# Patient Record
Sex: Male | Born: 1945 | Race: White | Hispanic: No | Marital: Married | Smoking: Former smoker
Health system: Southern US, Community
[De-identification: ages and names within clinical notes are randomized; demographics above are authoritative.]

## PROBLEM LIST (undated history)

## (undated) DIAGNOSIS — I712 Thoracic aortic aneurysm, without rupture, unspecified: Secondary | ICD-10-CM

## (undated) DIAGNOSIS — I2510 Atherosclerotic heart disease of native coronary artery without angina pectoris: Secondary | ICD-10-CM

## (undated) DIAGNOSIS — F329 Major depressive disorder, single episode, unspecified: Secondary | ICD-10-CM

## (undated) DIAGNOSIS — I10 Essential (primary) hypertension: Secondary | ICD-10-CM

## (undated) DIAGNOSIS — G8929 Other chronic pain: Secondary | ICD-10-CM

## (undated) DIAGNOSIS — I9789 Other postprocedural complications and disorders of the circulatory system, not elsewhere classified: Secondary | ICD-10-CM

## (undated) DIAGNOSIS — J45909 Unspecified asthma, uncomplicated: Secondary | ICD-10-CM

## (undated) DIAGNOSIS — I739 Peripheral vascular disease, unspecified: Secondary | ICD-10-CM

## (undated) DIAGNOSIS — Z951 Presence of aortocoronary bypass graft: Secondary | ICD-10-CM

## (undated) DIAGNOSIS — G629 Polyneuropathy, unspecified: Secondary | ICD-10-CM

## (undated) DIAGNOSIS — B3781 Candidal esophagitis: Secondary | ICD-10-CM

## (undated) DIAGNOSIS — F419 Anxiety disorder, unspecified: Secondary | ICD-10-CM

## (undated) DIAGNOSIS — Z789 Other specified health status: Secondary | ICD-10-CM

## (undated) DIAGNOSIS — I4891 Unspecified atrial fibrillation: Secondary | ICD-10-CM

## (undated) DIAGNOSIS — I359 Nonrheumatic aortic valve disorder, unspecified: Secondary | ICD-10-CM

## (undated) DIAGNOSIS — E785 Hyperlipidemia, unspecified: Secondary | ICD-10-CM

## (undated) DIAGNOSIS — G4733 Obstructive sleep apnea (adult) (pediatric): Secondary | ICD-10-CM

## (undated) DIAGNOSIS — G10 Huntington's disease: Secondary | ICD-10-CM

## (undated) DIAGNOSIS — M1990 Unspecified osteoarthritis, unspecified site: Secondary | ICD-10-CM

## (undated) DIAGNOSIS — K219 Gastro-esophageal reflux disease without esophagitis: Secondary | ICD-10-CM

## (undated) DIAGNOSIS — D649 Anemia, unspecified: Secondary | ICD-10-CM

## (undated) HISTORY — PX: FRACTURE SURGERY: SHX138

## (undated) HISTORY — PX: TONSILLECTOMY: SUR1361

## (undated) HISTORY — PX: ELECTROCONVULSIVE THERAPY: SHX1495

## (undated) HISTORY — PX: COLONOSCOPY: SHX174

---

## 2004-02-14 HISTORY — PX: LUMBAR LAMINECTOMY: SHX95

## 2006-02-21 DIAGNOSIS — I219 Acute myocardial infarction, unspecified: Secondary | ICD-10-CM

## 2006-02-21 HISTORY — PX: PERCUTANEOUS CORONARY STENT INTERVENTION (PCI-S): SHX6016

## 2006-02-21 HISTORY — DX: Acute myocardial infarction, unspecified: I21.9

## 2006-03-15 HISTORY — PX: CARDIAC CATHETERIZATION: SHX172

## 2006-04-03 DIAGNOSIS — I771 Stricture of artery: Secondary | ICD-10-CM

## 2006-04-03 HISTORY — PX: ANGIOPLASTY / STENTING ILIAC: SUR31

## 2006-04-03 HISTORY — DX: Stricture of artery: I77.1

## 2006-09-18 HISTORY — PX: PERCUTANEOUS CORONARY STENT INTERVENTION (PCI-S): SHX6016

## 2008-02-14 HISTORY — PX: TOTAL KNEE ARTHROPLASTY: SHX125

## 2009-11-13 HISTORY — PX: REVISION TOTAL KNEE ARTHROPLASTY: SHX767

## 2010-09-12 DIAGNOSIS — F32A Depression, unspecified: Secondary | ICD-10-CM | POA: Insufficient documentation

## 2011-02-14 DIAGNOSIS — E782 Mixed hyperlipidemia: Secondary | ICD-10-CM | POA: Insufficient documentation

## 2011-02-14 DIAGNOSIS — E349 Endocrine disorder, unspecified: Secondary | ICD-10-CM | POA: Insufficient documentation

## 2011-02-14 DIAGNOSIS — I10 Essential (primary) hypertension: Secondary | ICD-10-CM | POA: Insufficient documentation

## 2011-02-14 DIAGNOSIS — J45909 Unspecified asthma, uncomplicated: Secondary | ICD-10-CM | POA: Insufficient documentation

## 2011-03-17 ENCOUNTER — Emergency Department: Payer: Self-pay | Admitting: Emergency Medicine

## 2011-03-23 HISTORY — PX: CARDIAC CATHETERIZATION: SHX172

## 2011-03-29 HISTORY — PX: CORONARY ARTERY BYPASS GRAFT: SHX141

## 2011-04-27 ENCOUNTER — Encounter: Payer: Self-pay | Admitting: Internal Medicine

## 2011-05-15 ENCOUNTER — Encounter: Payer: Self-pay | Admitting: Internal Medicine

## 2011-06-14 ENCOUNTER — Encounter: Payer: Self-pay | Admitting: Internal Medicine

## 2011-08-08 DIAGNOSIS — G8929 Other chronic pain: Secondary | ICD-10-CM | POA: Insufficient documentation

## 2011-09-21 DIAGNOSIS — M961 Postlaminectomy syndrome, not elsewhere classified: Secondary | ICD-10-CM | POA: Insufficient documentation

## 2011-09-21 DIAGNOSIS — F1190 Opioid use, unspecified, uncomplicated: Secondary | ICD-10-CM | POA: Diagnosis present

## 2011-09-21 HISTORY — DX: Postlaminectomy syndrome, not elsewhere classified: M96.1

## 2012-07-15 DIAGNOSIS — Z79899 Other long term (current) drug therapy: Secondary | ICD-10-CM | POA: Insufficient documentation

## 2012-07-15 DIAGNOSIS — E291 Testicular hypofunction: Secondary | ICD-10-CM | POA: Insufficient documentation

## 2012-07-15 DIAGNOSIS — N138 Other obstructive and reflux uropathy: Secondary | ICD-10-CM | POA: Insufficient documentation

## 2012-08-13 DIAGNOSIS — I7101 Dissection of ascending aorta: Secondary | ICD-10-CM | POA: Insufficient documentation

## 2012-08-14 HISTORY — PX: REPAIR OF ACUTE ASCENDING THORACIC AORTIC DISSECTION: SHX6323

## 2012-08-19 DIAGNOSIS — I4891 Unspecified atrial fibrillation: Secondary | ICD-10-CM | POA: Insufficient documentation

## 2012-09-16 ENCOUNTER — Encounter: Payer: Self-pay | Admitting: Cardiothoracic Surgery

## 2012-10-14 ENCOUNTER — Encounter: Payer: Self-pay | Admitting: Cardiothoracic Surgery

## 2012-11-13 ENCOUNTER — Encounter: Payer: Self-pay | Admitting: Cardiothoracic Surgery

## 2012-12-14 ENCOUNTER — Encounter: Payer: Self-pay | Admitting: Cardiothoracic Surgery

## 2013-01-13 ENCOUNTER — Encounter: Payer: Self-pay | Admitting: Cardiothoracic Surgery

## 2013-02-13 ENCOUNTER — Encounter: Payer: Self-pay | Admitting: Cardiothoracic Surgery

## 2013-05-13 DIAGNOSIS — B3781 Candidal esophagitis: Secondary | ICD-10-CM | POA: Insufficient documentation

## 2014-04-01 DIAGNOSIS — F330 Major depressive disorder, recurrent, mild: Secondary | ICD-10-CM | POA: Insufficient documentation

## 2014-06-08 DIAGNOSIS — Z8679 Personal history of other diseases of the circulatory system: Secondary | ICD-10-CM

## 2014-06-08 DIAGNOSIS — Z955 Presence of coronary angioplasty implant and graft: Secondary | ICD-10-CM

## 2014-08-30 DIAGNOSIS — I359 Nonrheumatic aortic valve disorder, unspecified: Secondary | ICD-10-CM | POA: Insufficient documentation

## 2015-03-26 DIAGNOSIS — R339 Retention of urine, unspecified: Secondary | ICD-10-CM | POA: Insufficient documentation

## 2017-06-11 DIAGNOSIS — I712 Thoracic aortic aneurysm, without rupture, unspecified: Secondary | ICD-10-CM | POA: Insufficient documentation

## 2018-08-01 DIAGNOSIS — Z298 Encounter for other specified prophylactic measures: Secondary | ICD-10-CM | POA: Insufficient documentation

## 2018-09-12 ENCOUNTER — Other Ambulatory Visit (HOSPITAL_COMMUNITY): Payer: Self-pay | Admitting: Student

## 2018-09-12 ENCOUNTER — Other Ambulatory Visit: Payer: Self-pay | Admitting: Student

## 2018-09-12 DIAGNOSIS — M545 Low back pain, unspecified: Secondary | ICD-10-CM

## 2018-09-12 DIAGNOSIS — G8929 Other chronic pain: Secondary | ICD-10-CM

## 2018-09-23 ENCOUNTER — Ambulatory Visit
Admission: RE | Admit: 2018-09-23 | Discharge: 2018-09-23 | Disposition: A | Discharge status: Home / Self Care | Payer: Medicare Other | Source: Ambulatory Visit | Attending: Student | Admitting: Student

## 2018-09-23 DIAGNOSIS — G8929 Other chronic pain: Secondary | ICD-10-CM | POA: Diagnosis present

## 2018-09-23 DIAGNOSIS — M545 Low back pain: Secondary | ICD-10-CM | POA: Insufficient documentation

## 2019-02-18 HISTORY — PX: CATARACT EXTRACTION W/ INTRAOCULAR LENS IMPLANT: SHX1309

## 2019-03-04 HISTORY — PX: CATARACT EXTRACTION W/ INTRAOCULAR LENS IMPLANT: SHX1309

## 2019-05-06 HISTORY — PX: BLEPHAROPLASTY: SUR158

## 2020-04-09 DIAGNOSIS — G729 Myopathy, unspecified: Secondary | ICD-10-CM | POA: Insufficient documentation

## 2020-07-29 ENCOUNTER — Other Ambulatory Visit: Payer: Self-pay | Admitting: Orthopedic Surgery

## 2020-08-12 ENCOUNTER — Encounter: Payer: Self-pay | Admitting: Orthopedic Surgery

## 2020-08-12 ENCOUNTER — Encounter
Admission: RE | Admit: 2020-08-12 | Discharge: 2020-08-12 | Disposition: A | Discharge status: Home / Self Care | Payer: Medicare Other | Source: Ambulatory Visit | Attending: Orthopedic Surgery | Admitting: Orthopedic Surgery

## 2020-08-12 ENCOUNTER — Other Ambulatory Visit: Payer: Self-pay

## 2020-08-12 DIAGNOSIS — Z01818 Encounter for other preprocedural examination: Secondary | ICD-10-CM | POA: Insufficient documentation

## 2020-08-12 HISTORY — DX: Hyperlipidemia, unspecified: E78.5

## 2020-08-12 HISTORY — DX: Unspecified asthma, uncomplicated: J45.909

## 2020-08-12 HISTORY — DX: Anxiety disorder, unspecified: F41.9

## 2020-08-12 HISTORY — DX: Huntington's disease: G10

## 2020-08-12 HISTORY — DX: Polyneuropathy, unspecified: G62.9

## 2020-08-12 HISTORY — DX: Anemia, unspecified: D64.9

## 2020-08-12 HISTORY — DX: Atherosclerotic heart disease of native coronary artery without angina pectoris: I25.10

## 2020-08-12 HISTORY — DX: Peripheral vascular disease, unspecified: I73.9

## 2020-08-12 HISTORY — DX: Gastro-esophageal reflux disease without esophagitis: K21.9

## 2020-08-12 HISTORY — DX: Essential (primary) hypertension: I10

## 2020-08-12 HISTORY — DX: Unspecified osteoarthritis, unspecified site: M19.90

## 2020-08-12 LAB — COMPREHENSIVE METABOLIC PANEL
ALT: 21 U/L (ref 0–44)
AST: 23 U/L (ref 15–41)
Albumin: 3.9 g/dL (ref 3.5–5.0)
Alkaline Phosphatase: 61 U/L (ref 38–126)
Anion gap: 9 (ref 5–15)
BUN: 17 mg/dL (ref 8–23)
CO2: 27 mmol/L (ref 22–32)
Calcium: 9.5 mg/dL (ref 8.9–10.3)
Chloride: 103 mmol/L (ref 98–111)
Creatinine, Ser: 1.51 mg/dL — ABNORMAL HIGH (ref 0.61–1.24)
GFR, Estimated: 48 mL/min — ABNORMAL LOW (ref >60)
Glucose, Bld: 107 mg/dL — ABNORMAL HIGH (ref 70–99)
Potassium: 4.3 mmol/L (ref 3.5–5.1)
Sodium: 139 mmol/L (ref 135–145)
Total Bilirubin: 0.9 mg/dL (ref 0.3–1.2)
Total Protein: 7.2 g/dL (ref 6.5–8.1)

## 2020-08-12 LAB — CBC WITH DIFFERENTIAL/PLATELET
Abs Immature Granulocytes: 0.06 10*3/uL (ref 0.00–0.07)
Basophils Absolute: 0.1 10*3/uL (ref 0.0–0.1)
Basophils Relative: 1 %
Eosinophils Absolute: 0.2 10*3/uL (ref 0.0–0.5)
Eosinophils Relative: 1 %
HCT: 44.6 % (ref 39.0–52.0)
Hemoglobin: 15.8 g/dL (ref 13.0–17.0)
Immature Granulocytes: 0 %
Lymphocytes Relative: 26 %
Lymphs Abs: 3.7 10*3/uL (ref 0.7–4.0)
MCH: 32.8 pg (ref 26.0–34.0)
MCHC: 35.4 g/dL (ref 30.0–36.0)
MCV: 92.5 fL (ref 80.0–100.0)
Monocytes Absolute: 0.9 10*3/uL (ref 0.1–1.0)
Monocytes Relative: 6 %
Neutro Abs: 9.3 10*3/uL — ABNORMAL HIGH (ref 1.7–7.7)
Neutrophils Relative %: 66 %
Platelets: 395 10*3/uL (ref 150–400)
RBC: 4.82 MIL/uL (ref 4.22–5.81)
RDW: 12.5 % (ref 11.5–15.5)
WBC: 14.2 10*3/uL — ABNORMAL HIGH (ref 4.0–10.5)
nRBC: 0 % (ref 0.0–0.2)

## 2020-08-12 LAB — URINALYSIS, ROUTINE W REFLEX MICROSCOPIC
Bilirubin Urine: NEGATIVE
Glucose, UA: NEGATIVE mg/dL
Hgb urine dipstick: NEGATIVE
Ketones, ur: NEGATIVE mg/dL
Leukocytes,Ua: NEGATIVE
Nitrite: NEGATIVE
Protein, ur: NEGATIVE mg/dL
Specific Gravity, Urine: 1.014 (ref 1.005–1.030)
pH: 7 (ref 5.0–8.0)

## 2020-08-12 LAB — TYPE AND SCREEN
ABO/RH(D): A POS
Antibody Screen: NEGATIVE

## 2020-08-12 LAB — SURGICAL PCR SCREEN
MRSA, PCR: NEGATIVE
Staphylococcus aureus: POSITIVE — AB

## 2020-08-12 NOTE — Patient Instructions (Addendum)
Your procedure is scheduled on: 08/24/2020 Report to the Registration Desk on the 1st floor of the [facility]. To find out your arrival time, please call [phone] between 1PM - 3PM on: 08/23/2020  REMEMBER: Instructions that are not followed completely may result in serious medical risk, up to and including death; or upon the discretion of your surgeon and anesthesiologist your surgery may need to be rescheduled.  Do not eat food after midnight the night before surgery.  No gum chewing, lozengers or hard candies.  You may however, drink CLEAR liquids up to 2 hours before you are scheduled to arrive for your surgery. Do not drink anything within 2 hours of your scheduled arrival time.  Clear liquids include: - water  - apple juice without pulp - gatorade  - black coffee or tea (Do NOT add milk or creamers to the coffee or tea) Do NOT drink anything that is not on this list.  In addition, your doctor has ordered for you to drink the provided  Ensure Pre-Surgery Clear Carbohydrate Drink  Drinking this carbohydrate drink up to two hours before surgery helps to reduce insulin resistance and improve patient outcomes. Please complete drinking 2 hours prior to scheduled arrival time.  TAKE THESE MEDICATIONS THE MORNING OF SURGERY WITH A SIP OF WATER: - Pantoprazole (take one the night before and one on the morning of surgery - helps to prevent nausea after surgery.)  Use inhalers on the day of surgery and bring to the hospital.  One week prior to surgery: Stop Anti-inflammatories (NSAIDS) such as Advil, Aleve, Ibuprofen, Motrin, Naproxen, Naprosyn and Aspirin based products such as Excedrin, Goodys Powder, BC Powder. You may however, continue to take Tylenol if needed for pain up until the day of surgery.  Stop ANY OVER THE COUNTER vitamins and supplements until after surgery.   No Alcohol for 24 hours before or after surgery.  No Smoking including e-cigarettes for 24 hours prior  to surgery.  No chewable tobacco products for at least 6 hours prior to surgery.  No nicotine patches on the day of surgery.  Do not use any "recreational" drugs for at least a week prior to your surgery.  Please be advised that the combination of cocaine and anesthesia may have negative outcomes, up to and including death. If you test positive for cocaine, your surgery will be cancelled.  On the morning of surgery brush your teeth with toothpaste and water, you may rinse your mouth with mouthwash if you wish. Do not swallow any toothpaste or mouthwash.  Do not wear lotions, powders, or perfumes.   Do not shave body from the neck down 48 hours prior to surgery just in case you cut yourself which could leave a site for infection.  Also, freshly shaved skin may become irritated if using the CHG soap.  Contact lenses, hearing aids and dentures may not be worn into surgery.  Do not bring valuables to the hospital. [facility] is not responsible for any missing/lost belongings or valuables.   Use CHG Soap or wipes as directed on instruction sheet.  Notify your doctor if there is any change in your medical condition (cold, fever, infection).  Wear comfortable clothing (specific to your surgery type) to the hospital.  If you are being admitted to the hospital overnight, leave your suitcase in the car. After surgery it may be brought to your room.  If you are being discharged the day of surgery, you will not be allowed to drive  home. You will need a responsible adult (18 years or older) to drive you home and stay with you that night.   If you are taking public transportation, you will need to have a responsible adult (18 years or older) with you. Please confirm with your physician that it is acceptable to use public transportation.   Please call the [place] [facility] at [phone] if you have any questions about these instructions.  Surgery Visitation Policy:  Patients  undergoing a surgery or procedure may have one family member or support person with them as long as that person is not COVID-19 positive or experiencing its symptoms.  That person may remain in the waiting area during the procedure.  Inpatient Visitation:    Visiting hours are 7 a.m. to 8 p.m. Inpatients will be allowed two visitors daily. The visitors may change each day during the patient's stay. No visitors under the age of 65. Any visitor under the age of 63 must be accompanied by an adult. The visitor must pass COVID-19 screenings, use hand sanitizer when entering and exiting the patient's room and wear a mask at all times, including in the patient's room. Patients must also wear a mask when staff or their visitor are in the room. Masking is required regardless of vaccination status.

## 2020-08-13 ENCOUNTER — Encounter: Payer: Self-pay | Admitting: Orthopedic Surgery

## 2020-08-13 NOTE — Progress Notes (Signed)
Perioperative Services  Pre-Admission/Anesthesia Testing Clinical Review  Date: 08/19/20  Patient Demographics:  Name: Jordan Mason DOB:   17-Mar-1945 MRN:   [id number]  Planned Surgical Procedure(s):    Case: [id number] Date/Time: 08/24/20 1150   Procedure: TOTAL HIP ARTHROPLASTY ANTERIOR APPROACH (Right: Hip)   Anesthesia type: Choice   Pre-op diagnosis: Primary osteoarthritis of right hip M16.11   Location: [facility]   Surgeons: Hessie Knows, MD     NOTE: Available PAT nursing documentation and vital signs have been reviewed. Clinical nursing staff has updated patient's PMH/PSHx, current medication list, and drug allergies/intolerances to ensure comprehensive history available to assist in medical decision making as it pertains to the aforementioned surgical procedure and anticipated anesthetic course. Extensive review of available clinical information performed. Jordan Mason PMH and PSHx updated with any diagnoses/procedures that  may have been inadvertently omitted during his intake with the pre-admission testing department's nursing staff.  Clinical Discussion:  Jordan Mason is a 75 y.o. male who is submitted for pre-surgical anesthesia review and clearance prior to him undergoing the above procedure. Patient is a Former Smoker (cigars x 10 years; quit 03/2011). Pertinent PMH includes: CAD (s/p CABG), MI, TAA (s/p dissection and repair), aortic valve disease, postoperative atrial fibrillation, HTN, HLD, obstructive sleep apnea, asthma, GERD (on daily PPI), anemia, OA, Huntington's disease, post lumbar laminectomy syndrome, major depressive disorder.  Patient is followed by cardiology Edwin Dada, MD). He was last seen in the cardiology clinic on 03/12/2020; notes reviewed.  At the time of his clinic visit, patient doing well overall from a cardiovascular perspective.  He denied any episodes of chest pain, however he complained of increased exertional  dyspnea.  Patient attributed increasing shortness of breath to deconditioning from being less active during SARS-CoV-2 pandemic.  He denied any PND, orthopnea, palpitations, significant peripheral edema, vertiginous symptoms, or presyncope/syncope.  Patient with a PMH significant for cardiovascular disease.  Patient presented for treatment at [facility] on 02/21/2006 with complaints of LEFT-sided chest pain, pain in his LEFT upper extremity, and shortness of breath.  Determined to be cardiac in origin (MI).  Cardiac catheterization revealed single-vessel CAD (90% stenosis to the mid RCA.  BMS x 1 placed.  Repeat cardiac catheterization performed on 03/15/2006.  Diagnostic catheterization revealed nonobstructive CAD.  LVEF 69%.  There was 30% stenosis noted in the LEFT and 90% stenosis in the RIGHT iliac arteries.  Abdominal aortography with BILATERAL lower extremity runoff study performed on 04/03/2006. RIGHT iliac PTA and stenting was performed.   Myocardial perfusion imaging study performed on 09/18/2006 revealed no stress-induced myocardial arrhythmia.  LVEF by quantitative gated SPECT imaging was 62%.  There were no regional wall motion abnormalities noted.  Repeat cardiac catheterization performed on 09/18/2006 revealed in-stent restenosis of the RCA (70%).  Overlapping DES placed to ISR of the previous placed BMS located in the RCA resulting in a 10% residual postprocedural stenosis.  Stress echocardiograms performed on 09/22/2006  and 05/10/2007 revealed G1DD and trivial valvular insufficiency.  Cardiac MRI and thoracic MRI performed on 12/18/2008 revealed normal left ventricular systolic function with an EF of 60%. There was mild dilation of the thoracic aorta from the root through the proximal arch, with the maximum diameter being 4.0 cm.  Myocardial perfusion imaging study performed on 04/29/2008 revealed basilar inferoseptal dyskinesis.  Post-rest LVEF by quantitative gated SPECT imaging  measured at 56%.  Repeat stress echocardiogram performed on 03/10/2011 revealed no regional wall motion abnormalities, however  some target heart rate was noted.  There was trivial valvular insufficiency and grade 1 diastolic dysfunction.  Study demonstrated further dilation of the ascending aorta to 4.4 cm previously 4.0 cm in 2010.  Repeat cardiac catheterization performed on 03/23/2011 revealing significant LAD (90% pLAD, 90% mLAD, and 90% D1) LCx (70% and 90% lesions within the ramus intermedius), and RCA (60% pRCA) disease. Patient was referred for CVTS evaluation for consideration on CABG procedure.   Patient underwent a four-vessel CABG procedure on 03/29/2011.  LIMA-LAD, SVG-D1, SVG-ramus intermedius, and SVG-RCA bypass grafts were placed.  TTE was performed on 08/12/2012 that revealed normal left ventricular systolic function with mild LVH, mild pan valvular insufficiency, and progressive dilatation of the abdominal aorta with possible dissection flap noted.  Patient subsequently underwent a redo sternotomy for ascending aortic dissection/aneurysm repair; 28 mm graft placed.  Aortic valve resuspension/repair, central plication, and sinotubular junction remodeling procedures were simultaneously performed.   Last TTE was performed on 03/24/2019 revealed normal left ventricular systolic function with mild LVH, mild right ventricular systolic dysfunction, and mild pan valvular insufficiency.  There was no evidence of valvular stenosis.  LVEF estimated at >55%.  Myocardial perfusion imaging study revealed mild perfusion abnormality in the inferoseptal wall improving with stress; summed severity score 4 (see full interpretation of cardiovascular test below).  Blood pressure well controlled at 133/65 on currently prescribed ACEi and CCB therapies.  Patient is intolerant to statins, therefore is prescribed icosapent ethyl for his HLD diagnosis.  PCSK9 (Repatha) added.  Patient is on exogenous  testosterone therapy (injections). Functional capacity, as defined by DASI, is documented as being >/= 4 METS.  No changes were made to patient's medication regimen.  Patient to follow-up with outpatient cardiology in 6 months or sooner if needed.  Patient is scheduled for an elective total hip arthroplasty on 08/24/2020 with Dr. Hessie Knows, MD.  Given patient's past medical history significant for cardiovascular diagnoses, presurgical cardiac clearance was sought by the PAT team. Per cardiology, "this patient is optimized for surgery and may proceed with the planned procedural course with a MODERATE risk stratification".  Cardiology noted that patient should continue ASA throughout the perioperative period, however in review of his medication list, patient is not currently taking daily ASA dose.   Patient denies previous perioperative complications with anesthesia in the past. In review of the available records, it is noted that patient underwent a MAC anesthetic course at [facility] ([facility] III) in 04/2019 without documented complications.   Vitals with BMI 08/12/2020  Height _0   Weight 171 lbs 12 oz  BMI 40.81  Systolic 448  Diastolic 78  Pulse 62    Providers/Specialists:   NOTE: Primary physician provider listed below. Patient may have been seen by APP or partner within same practice.   PROVIDER ROLE / SPECIALTY LAST Fabio Bering, MD  Orthopedics (Surgeon) 07/23/2020  Donnamarie Rossetti, PA-C  Primary Care Provider 04/09/2020  Cloretta Ned, MD  Cardiology 03/12/2020   Allergies:  Vortioxetine, Atorvastatin, Bupropion, Morphine, Pravastatin, Rosuvastatin, Felodipine, Hydrochlorothiazide w-triamterene, Lisinopril, and Metoprolol  Current Home Medications:   No current facility-administered medications for this encounter.    amphetamine-dextroamphetamine (ADDERALL) 15 MG tablet   benazepril (LOTENSIN) 20 MG tablet   celecoxib (CELEBREX) 200 MG capsule    Cholecalciferol (VITAMIN D3 SUPER STRENGTH) 50 MCG (2000 UT) TABS   escitalopram (LEXAPRO) 20 MG tablet   fluticasone (FLONASE) 50 MCG/ACT nasal spray   ibuprofen (ADVIL) 200 MG tablet  LATUDA 40 MG TABS tablet   Multiple Vitamins-Minerals (MULTIVITAMIN WITH MINERALS) tablet   pantoprazole (PROTONIX) 40 MG tablet   polyethylene glycol (MIRALAX / GLYCOLAX) 17 g packet   SYMBICORT 80-4.5 MCG/ACT inhaler   testosterone cypionate (DEPOTESTOSTERONE CYPIONATE) 200 MG/ML injection   TIADYLT ER 360 MG 24 hr capsule   VASCEPA 1 g capsule   AUSTEDO 9 MG TABS   History:   Past Medical History:  Diagnosis Date   Anemia    Anxiety    Aortic valve disease    Arthritis    Asthma    Candidal esophagitis (HCC)    Chronic back pain    Coronary artery disease    GERD (gastroesophageal reflux disease)    Huntington disease (HCC)    Hx of CABG    Hyperlipidemia    Hypertension    Iliac artery stenosis, 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   a.)  s/p RIGHT sided PTA and stenting   MDD (major depressive disorder)    Myocardial infarction ([place]) 02/21/2006   Neuropathy    OSA (obstructive sleep apnea)    Peripheral vascular disease (HCC)    Postlaminectomy syndrome of lumbar region 09/21/2011   Postoperative atrial fibrillation (HCC)    Statin intolerance    Thoracic aortic aneurysm (HCC)    s/p dissection and repair in 08/2012   Past Surgical History:  Procedure Laterality Date   ANGIOPLASTY / STENTING ILIAC Right 04/03/2006   Procedure(s): abdominal aortography, bilateral lower extremity runoff, iliac PTA and stenting; Location: [facility]; Surgeon: Dionne Ano, MD   BLEPHAROPLASTY Bilateral 05/06/2019   Procedure: BLEPHAROPLASTY, UPPER EYELID; WITH EXCESSIVE SKIN WEIGHTING DOWN LID; Surgeon: Lucilla Lame, MD; Location: [facility]; Service: Ophthalmology; Laterality: Bilateral   CARDIAC CATHETERIZATION N/A 03/15/2006   Location: [facility]; Surgeon: Samella Parr, MD   CARDIAC CATHETERIZATION N/A  03/23/2011   Location: [facility]; Surgeon: Kathe Mariner, MD   CATARACT EXTRACTION W/ INTRAOCULAR LENS IMPLANT Right 03/04/2019   Procedure: EXTRACAPSULAR CATARACT PHACO REMOVAL WITH INSERTION OF INTRAOCULAR LENS PROSTHESIS (1 STAGE PROCEDURE), MANUAL OR MECHANICAL TECHNIQUE; Surgeon: Christean Grief, MD; Location: [facility]; Service: Ophthalmology; Laterality: Right; incision approximated   CATARACT EXTRACTION W/ INTRAOCULAR LENS IMPLANT Left 02/18/2019   Procedure: DEXTENZA- EXTRACAPSULAR CATARACT ECCE REMOVAL WITH INSERTION OF INTRAOCULAR LENS PROSTHESIS (1 STAGE PROCEDURE), MANUAL OR MECHANICAL TECHNIQUE; Surgeon: Christean Grief, MD; Location: [facility]; Service: Ophthalmology; Laterality: Left   COLONOSCOPY     CORONARY ARTERY BYPASS GRAFT N/A 03/29/2011   4v; LIMA-LAD, SVG-RI, SVG-D1, SVG-RCA   ELECTROCONVULSIVE THERAPY N/A    Multiple treatments   FRACTURE SURGERY     LUMBAR LAMINECTOMY N/A 02/2004   L4-L5 and L5-S1   PERCUTANEOUS CORONARY STENT INTERVENTION (PCI-S) N/A 02/21/2006   Procedure: PCI with 3.5 x 16 mm Liberte BMS x 1 to RCA; Location: [facility]; Surgeon: Dionne Ano, MD   PERCUTANEOUS CORONARY STENT INTERVENTION (PCI-S) N/A 09/18/2006   Procedure: overlapping 3.5 x 18 mm Xience DES to in stent of BMS in RCA; Location: [facility]; Surgeon: Lovena Neighbours, MD   REPAIR OF ACUTE ASCENDING THORACIC AORTIC DISSECTION N/A 08/14/2012   Redo sternotomy for repair Type A dissection with ascending aortic graft, aortic valve resuspension, aortic valve central plication of non-coronary cusp, STJ remodeling; Location: [facility]; Surgeon: Dr. Cheree Ditto   REVISION TOTAL KNEE ARTHROPLASTY Right 11/2009   TONSILLECTOMY     TOTAL KNEE ARTHROPLASTY Right 02/2008   No family history on file. Social History   Tobacco Use   Smoking status: Former  Pack years: 0.00    Types: Cigars    Quit date: 03/19/2011    Years since quitting: 9.4   Smokeless tobacco: Never  Substance Use Topics   Alcohol use: Not Currently    Drug use: Never    Pertinent Clinical Results:  LABS: Labs reviewed: Acceptable for surgery.  Hospital Outpatient Visit on 08/12/2020  Component Date Value Ref Range Status   WBC 08/12/2020 14.2 (A) 4.0 - 10.5 K/uL Final   RBC 08/12/2020 4.82  4.22 - 5.81 MIL/uL Final   Hemoglobin 08/12/2020 15.8  13.0 - 17.0 g/dL Final   HCT 08/12/2020 44.6  39.0 - 52.0 % Final   MCV 08/12/2020 92.5  80.0 - 100.0 fL Final   MCH 08/12/2020 32.8  26.0 - 34.0 pg Final   MCHC 08/12/2020 35.4  30.0 - 36.0 g/dL Final   RDW 08/12/2020 12.5  11.5 - 15.5 % Final   Platelets 08/12/2020 395  150 - 400 K/uL Final   nRBC 08/12/2020 0.0  0.0 - 0.2 % Final   Neutrophils Relative % 08/12/2020 66  % Final   Neutro Abs 08/12/2020 9.3 (A) 1.7 - 7.7 K/uL Final   Lymphocytes Relative 08/12/2020 26  % Final   Lymphs Abs 08/12/2020 3.7  0.7 - 4.0 K/uL Final   Monocytes Relative 08/12/2020 6  % Final   Monocytes Absolute 08/12/2020 0.9  0.1 - 1.0 K/uL Final   Eosinophils Relative 08/12/2020 1  % Final   Eosinophils Absolute 08/12/2020 0.2  0.0 - 0.5 K/uL Final   Basophils Relative 08/12/2020 1  % Final   Basophils Absolute 08/12/2020 0.1  0.0 - 0.1 K/uL Final   Immature Granulocytes 08/12/2020 0  % Final   Abs Immature Granulocytes 08/12/2020 0.06  0.00 - 0.07 K/uL Final   Performed at [facility], [place], [place] [address]   Sodium 08/12/2020 139  135 - 145 mmol/L Final   Potassium 08/12/2020 4.3  3.5 - 5.1 mmol/L Final   Chloride 08/12/2020 103  98 - 111 mmol/L Final   CO2 08/12/2020 27  22 - 32 mmol/L Final   Glucose, Bld 08/12/2020 107 (A) 70 - 99 mg/dL Final   Glucose reference range applies only to samples taken after fasting for at least 8 hours.   BUN 08/12/2020 17  8 - 23 mg/dL Final   Creatinine, Ser 08/12/2020 1.51 (A) 0.61 - 1.24 mg/dL Final   Calcium 08/12/2020 9.5  8.9 - 10.3 mg/dL Final   Total Protein 08/12/2020 7.2  6.5 - 8.1 g/dL Final   Albumin 08/12/2020 3.9  3.5 -  5.0 g/dL Final   AST 08/12/2020 23  15 - 41 U/L Final   ALT 08/12/2020 21  0 - 44 U/L Final   Alkaline Phosphatase 08/12/2020 61  38 - 126 U/L Final   Total Bilirubin 08/12/2020 0.9  0.3 - 1.2 mg/dL Final   GFR, Estimated 08/12/2020 48 (A) >60 mL/min Final   Comment: (NOTE) Calculated using the CKD-EPI Creatinine Equation (2021)    Anion gap 08/12/2020 9  5 - 15 Final   Performed at [facility], [place], [place] [address]   Color, Urine 08/12/2020 YELLOW (A) YELLOW Final   APPearance 08/12/2020 HAZY (A) CLEAR Final   Specific Gravity, Urine 08/12/2020 1.014  1.005 - 1.030 Final   pH 08/12/2020 7.0  5.0 - 8.0 Final   Glucose, UA 08/12/2020 NEGATIVE  NEGATIVE mg/dL Final   Hgb urine dipstick 08/12/2020 NEGATIVE  NEGATIVE Final   Bilirubin Urine 08/12/2020 NEGATIVE  NEGATIVE Final   Ketones, ur 08/12/2020 NEGATIVE  NEGATIVE mg/dL Final   Protein, ur 08/12/2020 NEGATIVE  NEGATIVE mg/dL Final   Nitrite 08/12/2020 NEGATIVE  NEGATIVE Final   Leukocytes,Ua 08/12/2020 NEGATIVE  NEGATIVE Final   Performed at [facility], [address] [place], [place] [address]   ABO/RH(D) 08/12/2020 A POS   Final   Antibody Screen 08/12/2020 NEG   Final   Sample Expiration 08/12/2020 08/26/2020,2359   Final   Extend sample reason 08/12/2020    Final                   Value:NO TRANSFUSIONS OR PREGNANCY IN THE PAST 3 MONTHS Performed at [facility], [address] [place], [place] [address]    MRSA, PCR 08/12/2020 NEGATIVE  NEGATIVE Final   Staphylococcus aureus 08/12/2020 POSITIVE (A) NEGATIVE Final   Comment: (NOTE) The Xpert SA Assay (FDA approved for NASAL specimens in patients 26 years of age and older), is one component of a comprehensive surveillance program. It is not intended to diagnose infection nor to guide or monitor treatment. Performed at [facility], [address] [place], [place] [address]     ECG: Date: 08/12/2020 Time  ECG obtained: 1130 AM Rate: 62 bpm Rhythm: normal sinus Axis (leads I and aVF): Normal Intervals: PR 136 ms. QRS 104 ms. QTc 420 ms. ST segment and T wave changes: No evidence of acute ST segment elevation or depression Comparison: Similar to previous tracing obtained on 11/19/2014 from [place] / PROCEDURES: DIAGNOSTIC RADIOGRAPHS RIGHT HIP WITH OR WITHOUT PELVIS performed on 05/25/2020 Moderate osteoarthritic changes Moderate sized subacromial spur along the superior aspect of the right acetabulum On lateral view, there does appear to be a small cam lesion present No acute fracture or lytic lesions identified  LEXISCAN performed on 03/23/2020 LVEF 44%, however visually appears normal with mild septal hypokinesis Diaphragmatic artifact noted Left ventricular cavity size normal Mild perfusion abnormality in the inferoseptal wall that improves with stress but does not completely resolve.  This is most consistent with artifact, however mild perfusion abnormality cannot be completely excluded Summed severity score is 4 Stress ECG is positive  PULMONARY FUNCTION TESTING performed on 03/22/2020  Ref Range & Units 4 mo ago  FVC Pre L 3.68   FEV1 Pre L 2.42   FEV1/FVC Pre % 65.66   FEF25-75% Pre L/s 1.27   PEF Pre L/s 6.41   FEV1/SVC Pre % 64.34   MVV Pre L/min 91   DLCO Pre ml/(min*mmHg) 19.48   VA Pre L 4.92   IVC Pre L 3.27   BHT Pre sec 10.14   KCO_PRE ml/(min*mmHg*L) 3.96   TLC Pre L 5.74   VC Pre L 3.76   RV Pre L 1.98   FRC N2 Pre L 2.39   ERV Pre L 0.47   IC_N2_PRE L 3.35   FVC_LLN  2.86   FVC_Z-SCORE  -0.34   FVC_%PRED % 95 %   FVC_Z-SCORE  -0.34   FEV1_LLN  2.09   FEV1_Z-SCORE  -1.02   FEV1_%PRED % 82 %   FEV1_Z-SCORE  -1.02   FEV1/FVC_LLN  62   FEF25-75%_LLN  0.91   FEF25-75%_Z-SCORE  -1.1   FEF25-75%_%PRED % 58 %   FEF25-75%_Z-SCORE  -1.1   PEF_LLN  5.5   PEF_Z-SCORE  -0.97   PEF_%PRED % 83 %   PEF_Z-SCORE  -0.97   FEV1%VCMAX_LLN  62  MVV_LLN   110   MVV_%PRED % 83 %   TLC_N2_LLN  5.67   TLC_N2_ULN  7.97   TLC_N2_Z-SCORE  -1.54   TLC_N2_%PRED % 84.2   TLC_N2_Z-SCORE  -1.54   VCMAX_N2_LLN  2.86   VCMAX_N2_ULN  4.95   VCMAX_N2_Z-SCORE  -0.22   VCMAX_N2_%PRED % 96.5   VCMAX_N2_Z-SCORE  -0.22   RV_N2_LLN  1.98   RV_N2_ULN  3.33   RV_N2_Z-SCORE  -1.64   RV_N2_%PRED % 74.6   RV_N2_Z-SCORE  -1.64   FRC_N2_LLN  2.65   FRC_N2_ULN  4.63   FRC_N2_Z-SCORE  -2.08   FRC_N2_%PRED % 65.7   FRC_N2_Z-SCORE  -2.08   ERV_N2_LLN  0.98   ERV_N2_%PRED % 47.3   IC_N2_LLN  2.94   IC_N2_%PRED % 114.1   DLCOSINGLEBREATH_%PRED  24.17   DLCOSINGLEBREATH_LLN  17.69   DLCOSINGLEBREATH_Z-SCORE  -1.16   DLCOSINGLEBREATH_%PRED % 80.6   DLCOSINGLEBREATH_Z-SCORE  -1.16   VASINGLEBREATH_LLN  4.82   VASINGLEBREATH_Z-SCORE  -1.5   VASINGLEBREATH_%PRED % 81.6   VASINGLEBREATH_Z-SCORE  -1.5   IVCSINGLEBREATH_LLN  2.86   IVCSINGLEBREATH_Z-SCORE  -0.99   IVCSINGLEBREATH_%PRED % 84   IVCSINGLEBREATH_Z-SCORE  -0.99   KCO_LLN  2.98   KCO_Z-SCORE  -0.11   KCO_%PRE % 98.1   KCO_Z-SCORE  -0.11   Resulting Agency  [facility]  Specimen Collected: 03/22/20 10:12 Last Resulted: 03/28/20 09:34  Received From: [facility]  Result Received: 08/02/20 19:14   TRANSTHORACIC ECHOCARDIOGRAM performed on 03/24/2019 LVEF >55% Normal left ventricular systolic function with mild LVH Mild right atrial enlargement Mild right ventricular systolic dysfunction Mild AR and TR Trivial MR and PR No valvular stenosis no evidence of pericardial effusion  LEFT HEART CATHETERIZATION AND CORONARY ANGIOGRAPHY performed on 08/15/2012 Patent LIMA-LAD graft Patent SVG-D1 graft  CORONARY ARTERY BYPASS GRAFTING PROCEDURE performed on 03/29/2011 LVEF 55% Four-vessel CABG procedure LIMA-LAD SVG-ramus intermedius SVG-D1 SVG-RCA  LEFT HEART CATHETERIZATION AND CORONARY ANGIOGRAPHY performed on 03/23/2011 Significant three-vessel  CAD 60% stenosis of the proximal RCA 70% and 90% stenoses of the ramus intermedius 90% stenosis of the proximal LAD 90% stenosis of the mid LAD 90% stenosis of D1 LVEDP = 25 mmHg Very complex bifurcation lesion of the LAD and large diagonal in addition to RI ostial lesion of 70%.  Recommend CABG.  STRESS ECHOCARDIOGRAM performed on 03/10/2011 No regional wall motion abnormalities seen at some target heart rate Group trivial AR, TR, and PR Grade 1 diastolic dysfunction Dilated ascending aorta measuring 4.4 cm  LEFT HEART CATHETERIZATION AND CORONARY ANGIOGRAPHY performed on 09/18/2006 LVEF 63% CAD 20% and 30% stenoses of the proximal RCA 70% stenosis of the mid RCA 10% stenosis of the proximal LAD 30% stenosis of the mid LAD Overlapping 3.5 x 18 mm Xience DES placed to ISR of the previously placed BMS located in the RCA resulting in a 10% residual postprocedural stenosis.  LEFT HEART CATHETERIZATION AND CORONARY ANGIOGRAPHY performed on 03/15/2006 LVEF 69% Nonobstructive CAD 20% stenosis of the proximal RCA Areas of 20% stenosis x 3 to the distal RCA 20% stenosis of the proximal LAD 20% stenosis of the mid LAD  LEFT HEART CATHETERIZATION AND CORONARY ANGIOGRAPHY performed on 02/21/2006 CAD 20% stenosis proximal RCA 90% stenosis mid RCA 60% stenosis acute marginal 20% stenosis proximal LCx 20% stenosis OM1 40% and 20% lesions in the mid LAD Successful PCI with placement of a 3.5 x 16 mm Liberte BMS  placed to the mid RCA  Impression and Plan:  Jordan Mason has been referred for pre-anesthesia  review and clearance prior to him undergoing the planned anesthetic and procedural courses. Available labs, pertinent testing, and imaging results were personally reviewed by me. This patient has been appropriately cleared by cardiology with an overall MODERATE risk of significant perioperative cardiovascular complications.  Based on clinical review performed today (08/19/20),  barring any significant acute changes in the patient's overall condition, it is anticipated that he will be able to proceed with the planned surgical intervention. Any acute changes in clinical condition may necessitate his procedure being postponed and/or cancelled. Patient will meet with anesthesia team (MD and/or CRNA) on the day of his procedure for preoperative evaluation/assessment. Questions regarding anesthetic course will be fielded at that time.   Pre-surgical instructions were reviewed with the patient during his PAT appointment and questions were fielded by PAT clinical staff. Patient was advised that if any questions or concerns arise prior to his procedure then he should return a call to PAT and/or his surgeon's office to discuss.  Honor Loh, MSN, APRN, FNP-C, CEN [facility]  Peri-operative Services Nurse Practitioner Phone: [phone] Fax: [phone] 08/19/20 10:22 AM  NOTE: This note has been prepared using Dragon dictation software. Despite my best ability to proofread, there is always the potential that unintentional transcriptional errors may still occur from this process.

## 2020-08-20 ENCOUNTER — Other Ambulatory Visit
Admission: RE | Admit: 2020-08-20 | Discharge: 2020-08-20 | Disposition: A | Discharge status: Home / Self Care | Payer: Medicare Other | Source: Ambulatory Visit | Attending: Orthopedic Surgery | Admitting: Orthopedic Surgery

## 2020-08-20 ENCOUNTER — Other Ambulatory Visit: Payer: Self-pay

## 2020-08-20 DIAGNOSIS — Z01812 Encounter for preprocedural laboratory examination: Secondary | ICD-10-CM | POA: Insufficient documentation

## 2020-08-20 DIAGNOSIS — Z20822 Contact with and (suspected) exposure to covid-19: Secondary | ICD-10-CM | POA: Diagnosis not present

## 2020-08-20 LAB — SARS CORONAVIRUS 2 (TAT 6-24 HRS): SARS Coronavirus 2: NEGATIVE

## 2020-08-24 ENCOUNTER — Observation Stay
Admission: RE | Admit: 2020-08-24 | Discharge: 2020-08-25 | Disposition: A | Discharge status: Home / Self Care | Payer: Medicare Other | Attending: Orthopedic Surgery | Admitting: Orthopedic Surgery

## 2020-08-24 ENCOUNTER — Other Ambulatory Visit: Payer: Self-pay

## 2020-08-24 ENCOUNTER — Inpatient Hospital Stay: Payer: Medicare Other | Admitting: Urgent Care

## 2020-08-24 ENCOUNTER — Inpatient Hospital Stay: Payer: Medicare Other

## 2020-08-24 ENCOUNTER — Encounter: Payer: Self-pay | Admitting: Orthopedic Surgery

## 2020-08-24 ENCOUNTER — Encounter
Admission: RE | Disposition: A | Discharge status: Home / Self Care | Payer: Self-pay | Source: Home / Self Care | Attending: Orthopedic Surgery

## 2020-08-24 DIAGNOSIS — Z96651 Presence of right artificial knee joint: Secondary | ICD-10-CM | POA: Diagnosis not present

## 2020-08-24 DIAGNOSIS — M1611 Unilateral primary osteoarthritis, right hip: Secondary | ICD-10-CM | POA: Diagnosis not present

## 2020-08-24 DIAGNOSIS — Z79899 Other long term (current) drug therapy: Secondary | ICD-10-CM | POA: Diagnosis not present

## 2020-08-24 DIAGNOSIS — Z951 Presence of aortocoronary bypass graft: Secondary | ICD-10-CM | POA: Diagnosis not present

## 2020-08-24 DIAGNOSIS — I10 Essential (primary) hypertension: Secondary | ICD-10-CM | POA: Insufficient documentation

## 2020-08-24 DIAGNOSIS — Z955 Presence of coronary angioplasty implant and graft: Secondary | ICD-10-CM | POA: Insufficient documentation

## 2020-08-24 DIAGNOSIS — J45909 Unspecified asthma, uncomplicated: Secondary | ICD-10-CM | POA: Insufficient documentation

## 2020-08-24 DIAGNOSIS — Z96649 Presence of unspecified artificial hip joint: Secondary | ICD-10-CM

## 2020-08-24 DIAGNOSIS — I2510 Atherosclerotic heart disease of native coronary artery without angina pectoris: Secondary | ICD-10-CM | POA: Diagnosis not present

## 2020-08-24 DIAGNOSIS — G8918 Other acute postprocedural pain: Secondary | ICD-10-CM

## 2020-08-24 DIAGNOSIS — Z96641 Presence of right artificial hip joint: Secondary | ICD-10-CM

## 2020-08-24 DIAGNOSIS — Z419 Encounter for procedure for purposes other than remedying health state, unspecified: Secondary | ICD-10-CM

## 2020-08-24 HISTORY — DX: Presence of aortocoronary bypass graft: Z95.1

## 2020-08-24 HISTORY — DX: Other chronic pain: G89.29

## 2020-08-24 HISTORY — DX: Obstructive sleep apnea (adult) (pediatric): G47.33

## 2020-08-24 HISTORY — DX: Major depressive disorder, single episode, unspecified: F32.9

## 2020-08-24 HISTORY — DX: Unspecified atrial fibrillation: I48.91

## 2020-08-24 HISTORY — DX: Thoracic aortic aneurysm, without rupture, unspecified: I71.20

## 2020-08-24 HISTORY — DX: Candidal esophagitis: B37.81

## 2020-08-24 HISTORY — DX: Unspecified atrial fibrillation: I97.89

## 2020-08-24 HISTORY — DX: Nonrheumatic aortic valve disorder, unspecified: I35.9

## 2020-08-24 HISTORY — DX: Thoracic aortic aneurysm, without rupture: I71.2

## 2020-08-24 HISTORY — PX: TOTAL HIP ARTHROPLASTY: SHX124

## 2020-08-24 HISTORY — DX: Other specified health status: Z78.9

## 2020-08-24 LAB — CBC
HCT: 37.6 % — ABNORMAL LOW (ref 39.0–52.0)
Hemoglobin: 13.6 g/dL (ref 13.0–17.0)
MCH: 34.3 pg — ABNORMAL HIGH (ref 26.0–34.0)
MCHC: 36.2 g/dL — ABNORMAL HIGH (ref 30.0–36.0)
MCV: 94.7 fL (ref 80.0–100.0)
Platelets: 333 10*3/uL (ref 150–400)
RBC: 3.97 MIL/uL — ABNORMAL LOW (ref 4.22–5.81)
RDW: 12.9 % (ref 11.5–15.5)
WBC: 19.7 10*3/uL — ABNORMAL HIGH (ref 4.0–10.5)
nRBC: 0 % (ref 0.0–0.2)

## 2020-08-24 LAB — CREATININE, SERUM
Creatinine, Ser: 1.13 mg/dL (ref 0.61–1.24)
GFR, Estimated: >60 mL/min (ref >60)

## 2020-08-24 LAB — ABO/RH: ABO/RH(D): A POS

## 2020-08-24 SURGERY — ARTHROPLASTY, HIP, TOTAL, ANTERIOR APPROACH
Anesthesia: Monitor Anesthesia Care | Site: Hip | Laterality: Right

## 2020-08-24 MED ORDER — DILTIAZEM HCL ER COATED BEADS 240 MG PO CP24
360.0000 mg | ORAL_CAPSULE | Freq: Every day | ORAL | Status: DC
  Administered 2020-08-24: 360 mg via ORAL
  Filled 2020-08-24: qty 1

## 2020-08-24 MED ORDER — ZOLPIDEM TARTRATE 5 MG PO TABS
5.0000 mg | ORAL_TABLET | Freq: Every evening | ORAL | Status: DC | PRN
Start: 2020-08-24 — End: 2020-08-25

## 2020-08-24 MED ORDER — HYDROCODONE-ACETAMINOPHEN 5-325 MG PO TABS
1.0000 | ORAL_TABLET | ORAL | Status: DC | PRN
  Administered 2020-08-24 – 2020-08-25 (×3): 2 via ORAL
  Filled 2020-08-24 (×2): qty 2

## 2020-08-24 MED ORDER — PNEUMOCOCCAL VAC POLYVALENT 25 MCG/0.5ML IJ INJ: 0.5000 mL | INJECTION | INTRAMUSCULAR | Status: DC

## 2020-08-24 MED ORDER — PHENOL 1.4 % MT LIQD
1.0000 | OROMUCOSAL | Status: DC | PRN
  Filled 2020-08-24: qty 177

## 2020-08-24 MED ORDER — CHLORHEXIDINE GLUCONATE 0.12 % MT SOLN
15.0000 mL | Freq: Once | OROMUCOSAL | Status: AC
  Administered 2020-08-24: 15 mL via OROMUCOSAL

## 2020-08-24 MED ORDER — AMPHETAMINE-DEXTROAMPHETAMINE 10 MG PO TABS
15.0000 mg | ORAL_TABLET | Freq: Two times a day (BID) | ORAL | Status: DC
  Administered 2020-08-25: 15 mg via ORAL
  Filled 2020-08-24: qty 2

## 2020-08-24 MED ORDER — FLUTICASONE FUROATE-VILANTEROL 100-25 MCG/INH IN AEPB
1.0000 | INHALATION_SPRAY | Freq: Every day | RESPIRATORY_TRACT | Status: DC
  Administered 2020-08-25: 1 via RESPIRATORY_TRACT
  Filled 2020-08-24: qty 28

## 2020-08-24 MED ORDER — FENTANYL CITRATE (PF) 100 MCG/2ML IJ SOLN: 25.0000 ug | INTRAMUSCULAR | Status: DC | PRN

## 2020-08-24 MED ORDER — MENTHOL 3 MG MT LOZG
1.0000 | LOZENGE | OROMUCOSAL | Status: DC | PRN
  Filled 2020-08-24: qty 9

## 2020-08-24 MED ORDER — METHOCARBAMOL 1000 MG/10ML IJ SOLN
500.0000 mg | Freq: Four times a day (QID) | INTRAVENOUS | Status: DC | PRN
  Filled 2020-08-24: qty 5

## 2020-08-24 MED ORDER — PROPOFOL 500 MG/50ML IV EMUL
INTRAVENOUS | Status: DC | PRN
  Administered 2020-08-24: 50 ug/kg/min via INTRAVENOUS

## 2020-08-24 MED ORDER — ONDANSETRON HCL 4 MG/2ML IJ SOLN: 4.0000 mg | Freq: Four times a day (QID) | INTRAMUSCULAR | Status: DC | PRN

## 2020-08-24 MED ORDER — METHOCARBAMOL 500 MG PO TABS: 500.0000 mg | ORAL_TABLET | Freq: Four times a day (QID) | ORAL | Status: DC | PRN

## 2020-08-24 MED ORDER — MORPHINE SULFATE (PF) 2 MG/ML IV SOLN
0.5000 mg | INTRAVENOUS | Status: DC | PRN
  Administered 2020-08-24: 1 mg via INTRAVENOUS
  Filled 2020-08-24: qty 1

## 2020-08-24 MED ORDER — MAGNESIUM HYDROXIDE 400 MG/5ML PO SUSP: 30.0000 mL | Freq: Every day | ORAL | Status: DC | PRN

## 2020-08-24 MED ORDER — BENAZEPRIL HCL 20 MG PO TABS
20.0000 mg | ORAL_TABLET | Freq: Every day | ORAL | Status: DC
  Administered 2020-08-24: 20 mg via ORAL
  Filled 2020-08-24 (×2): qty 1

## 2020-08-24 MED ORDER — ONDANSETRON HCL 4 MG/2ML IJ SOLN: 4.0000 mg | Freq: Once | INTRAMUSCULAR | Status: DC | PRN

## 2020-08-24 MED ORDER — ADULT MULTIVITAMIN W/MINERALS CH
1.0000 | ORAL_TABLET | Freq: Every day | ORAL | Status: DC
  Administered 2020-08-25: 1 via ORAL
  Filled 2020-08-24: qty 1

## 2020-08-24 MED ORDER — DIPHENHYDRAMINE HCL 12.5 MG/5ML PO ELIX: 12.5000 mg | ORAL_SOLUTION | ORAL | Status: DC | PRN

## 2020-08-24 MED ORDER — SODIUM CHLORIDE 0.9 % IV SOLN: INTRAVENOUS | Status: DC

## 2020-08-24 MED ORDER — ENOXAPARIN SODIUM 40 MG/0.4ML IJ SOSY
40.0000 mg | PREFILLED_SYRINGE | INTRAMUSCULAR | Status: DC
  Administered 2020-08-25: 40 mg via SUBCUTANEOUS
  Filled 2020-08-24: qty 0.4

## 2020-08-24 MED ORDER — FLUTICASONE PROPIONATE 50 MCG/ACT NA SUSP
2.0000 | Freq: Every day | NASAL | Status: DC
  Administered 2020-08-25: 2 via NASAL
  Filled 2020-08-24: qty 16

## 2020-08-24 MED ORDER — HYDROCODONE-ACETAMINOPHEN 5-325 MG PO TABS
ORAL_TABLET | ORAL | Status: AC
  Filled 2020-08-24: qty 2

## 2020-08-24 MED ORDER — METOCLOPRAMIDE HCL 5 MG/ML IJ SOLN: 5.0000 mg | Freq: Three times a day (TID) | INTRAMUSCULAR | Status: DC | PRN

## 2020-08-24 MED ORDER — PROPOFOL 10 MG/ML IV BOLUS
INTRAVENOUS | Status: DC | PRN
  Administered 2020-08-24: 20 mg via INTRAVENOUS
  Administered 2020-08-24 (×2): 30 mg via INTRAVENOUS

## 2020-08-24 MED ORDER — ORAL CARE MOUTH RINSE: 15.0000 mL | Freq: Once | OROMUCOSAL | Status: AC

## 2020-08-24 MED ORDER — BUPIVACAINE HCL (PF) 0.5 % IJ SOLN
INTRAMUSCULAR | Status: DC | PRN
  Administered 2020-08-24: 2.5 mL via INTRATHECAL

## 2020-08-24 MED ORDER — DOCUSATE SODIUM 100 MG PO CAPS
100.0000 mg | ORAL_CAPSULE | Freq: Two times a day (BID) | ORAL | Status: DC
  Administered 2020-08-25: 100 mg via ORAL
  Filled 2020-08-24 (×2): qty 1

## 2020-08-24 MED ORDER — METOCLOPRAMIDE HCL 10 MG PO TABS
5.0000 mg | ORAL_TABLET | Freq: Three times a day (TID) | ORAL | Status: DC | PRN
Start: 2020-08-24 — End: 2020-08-25

## 2020-08-24 MED ORDER — PROPOFOL 1000 MG/100ML IV EMUL
INTRAVENOUS | Status: AC
  Filled 2020-08-24: qty 100

## 2020-08-24 MED ORDER — FENTANYL CITRATE (PF) 100 MCG/2ML IJ SOLN
INTRAMUSCULAR | Status: AC
  Administered 2020-08-24: 50 ug via INTRAVENOUS
  Filled 2020-08-24: qty 2

## 2020-08-24 MED ORDER — PHENYLEPHRINE HCL (PRESSORS) 10 MG/ML IV SOLN
INTRAVENOUS | Status: DC | PRN
  Administered 2020-08-24 (×3): 100 ug via INTRAVENOUS

## 2020-08-24 MED ORDER — HYDROCODONE-ACETAMINOPHEN 7.5-325 MG PO TABS
1.0000 | ORAL_TABLET | ORAL | Status: DC | PRN
  Administered 2020-08-24: 2 via ORAL
  Filled 2020-08-24: qty 2

## 2020-08-24 MED ORDER — LACTATED RINGERS IV SOLN: INTRAVENOUS | Status: DC

## 2020-08-24 MED ORDER — CHLORHEXIDINE GLUCONATE 0.12 % MT SOLN
OROMUCOSAL | Status: AC
  Filled 2020-08-24: qty 15

## 2020-08-24 MED ORDER — PANTOPRAZOLE SODIUM 40 MG PO TBEC
40.0000 mg | DELAYED_RELEASE_TABLET | Freq: Every day | ORAL | Status: DC
  Administered 2020-08-25: 40 mg via ORAL
  Filled 2020-08-24: qty 1

## 2020-08-24 MED ORDER — BISACODYL 10 MG RE SUPP: 10.0000 mg | Freq: Every day | RECTAL | Status: DC | PRN

## 2020-08-24 MED ORDER — CEFAZOLIN SODIUM-DEXTROSE 2-4 GM/100ML-% IV SOLN
INTRAVENOUS | Status: AC
  Filled 2020-08-24: qty 100

## 2020-08-24 MED ORDER — SODIUM CHLORIDE 0.9 % IV SOLN
INTRAVENOUS | Status: DC | PRN
  Administered 2020-08-24: 20 ug/min via INTRAVENOUS

## 2020-08-24 MED ORDER — LURASIDONE HCL 40 MG PO TABS
40.0000 mg | ORAL_TABLET | Freq: Every day | ORAL | Status: DC
  Administered 2020-08-24: 40 mg via ORAL
  Filled 2020-08-24 (×2): qty 1

## 2020-08-24 MED ORDER — CEFAZOLIN SODIUM-DEXTROSE 2-4 GM/100ML-% IV SOLN
2.0000 g | INTRAVENOUS | Status: AC
  Administered 2020-08-24: 2 g via INTRAVENOUS

## 2020-08-24 MED ORDER — ICOSAPENT ETHYL 1 G PO CAPS
2.0000 g | ORAL_CAPSULE | Freq: Two times a day (BID) | ORAL | Status: DC
  Administered 2020-08-24 – 2020-08-25 (×2): 2 g via ORAL
  Filled 2020-08-24 (×3): qty 2

## 2020-08-24 MED ORDER — ALUM & MAG HYDROXIDE-SIMETH 200-200-20 MG/5ML PO SUSP: 30.0000 mL | ORAL | Status: DC | PRN

## 2020-08-24 MED ORDER — MAGNESIUM CITRATE PO SOLN
1.0000 | Freq: Once | ORAL | Status: DC | PRN
  Filled 2020-08-24: qty 296

## 2020-08-24 MED ORDER — VITAMIN D3 25 MCG (1000 UNIT) PO TABS
2000.0000 [IU] | ORAL_TABLET | Freq: Every day | ORAL | Status: DC
  Administered 2020-08-25: 2000 [IU] via ORAL
  Filled 2020-08-24 (×2): qty 2

## 2020-08-24 MED ORDER — POLYETHYLENE GLYCOL 3350 17 G PO PACK
17.0000 g | PACK | Freq: Every day | ORAL | Status: DC
  Administered 2020-08-25: 17 g via ORAL
  Filled 2020-08-24: qty 1

## 2020-08-24 MED ORDER — ESCITALOPRAM OXALATE 20 MG PO TABS
20.0000 mg | ORAL_TABLET | Freq: Every day | ORAL | Status: DC
  Administered 2020-08-24: 20 mg via ORAL
  Filled 2020-08-24 (×2): qty 1

## 2020-08-24 MED ORDER — PROPOFOL 10 MG/ML IV BOLUS
INTRAVENOUS | Status: AC
  Filled 2020-08-24: qty 20

## 2020-08-24 MED ORDER — ONDANSETRON HCL 4 MG PO TABS: 4.0000 mg | ORAL_TABLET | Freq: Four times a day (QID) | ORAL | Status: DC | PRN

## 2020-08-24 MED ORDER — CEFAZOLIN SODIUM-DEXTROSE 2-4 GM/100ML-% IV SOLN
2.0000 g | Freq: Four times a day (QID) | INTRAVENOUS | Status: AC
  Administered 2020-08-24 – 2020-08-25 (×2): 2 g via INTRAVENOUS
  Filled 2020-08-24 (×2): qty 100

## 2020-08-24 MED ORDER — ACETAMINOPHEN 325 MG PO TABS: 325.0000 mg | ORAL_TABLET | Freq: Four times a day (QID) | ORAL | Status: DC | PRN

## 2020-08-24 MED ORDER — TRAMADOL HCL 50 MG PO TABS
50.0000 mg | ORAL_TABLET | Freq: Four times a day (QID) | ORAL | Status: DC
  Administered 2020-08-24 – 2020-08-25 (×2): 50 mg via ORAL
  Filled 2020-08-24 (×3): qty 1

## 2020-08-24 SURGICAL SUPPLY — 64 items
APL PRP STRL LF DISP 70% ISPRP (MISCELLANEOUS) ×1
BLADE SAGITTAL AGGR TOOTH XLG (BLADE) ×2 IMPLANT
BNDG COHESIVE 6X5 TAN STRL LF (GAUZE/BANDAGES/DRESSINGS) ×6 IMPLANT
CANISTER SUCT 1200ML W/VALVE (MISCELLANEOUS) ×2 IMPLANT
CANISTER WOUND CARE 500ML ATS (WOUND CARE) ×2 IMPLANT
CHLORAPREP W/TINT 26 (MISCELLANEOUS) ×2 IMPLANT
COVER BACK TABLE REUSABLE LG (DRAPES) ×2 IMPLANT
COVER LIGHT HANDLE STERIS (MISCELLANEOUS) ×2 IMPLANT
CUP ACETAB VERSA DBL 28X58 DMI (Orthopedic Implant) ×2 IMPLANT
DRAPE 3/4 80X56 (DRAPES) ×6 IMPLANT
DRAPE C-ARM XRAY 36X54 (DRAPES) ×4 IMPLANT
DRAPE INCISE IOBAN 66X60 STRL (DRAPES) IMPLANT
DRAPE POUCH INSTRU U-SHP 10X18 (DRAPES) ×2 IMPLANT
DRESSING SURGICEL FIBRLLR 1X2 (HEMOSTASIS) ×2 IMPLANT
DRSG MEPILEX SACRM 8.7X9.8 (GAUZE/BANDAGES/DRESSINGS) ×2 IMPLANT
DRSG OPSITE POSTOP 4X8 (GAUZE/BANDAGES/DRESSINGS) ×4 IMPLANT
DRSG SURGICEL FIBRILLAR 1X2 (HEMOSTASIS) ×4
ELECT BLADE 6.5 EXT (BLADE) ×2 IMPLANT
ELECT REM PT RETURN 9FT ADLT (ELECTROSURGICAL) ×2
ELECTRODE REM PT RTRN 9FT ADLT (ELECTROSURGICAL) ×1 IMPLANT
GAUZE 4X4 16PLY ~~LOC~~+RFID DBL (SPONGE) ×2 IMPLANT
GLOVE SURG SYN 9.0  PF PI (GLOVE) ×2
GLOVE SURG SYN 9.0 PF PI (GLOVE) ×2 IMPLANT
GLOVE SURG UNDER POLY LF SZ9 (GLOVE) ×2 IMPLANT
GOWN SRG 2XL LVL 4 RGLN SLV (GOWNS) ×1 IMPLANT
GOWN STRL NON-REIN 2XL LVL4 (GOWNS) ×2
GOWN STRL REUS W/ TWL LRG LVL3 (GOWN DISPOSABLE) ×1 IMPLANT
GOWN STRL REUS W/TWL LRG LVL3 (GOWN DISPOSABLE) ×2
HEMOVAC 400CC 10FR (MISCELLANEOUS) IMPLANT
HIP FEM HD S 28 (Head) ×2 IMPLANT
HOLDER FOLEY CATH W/STRAP (MISCELLANEOUS) ×2 IMPLANT
HOOD PEEL AWAY FLYTE STAYCOOL (MISCELLANEOUS) ×2 IMPLANT
IRRIGATION SURGIPHOR STRL (IV SOLUTION) IMPLANT
KIT PREVENA INCISION MGT 13 (CANNISTER) ×2 IMPLANT
MANIFOLD NEPTUNE II (INSTRUMENTS) ×2 IMPLANT
MASTERLOC HIP LATERAL S6 (Hips) ×2 IMPLANT
MAT ABSORB  FLUID 56X50 GRAY (MISCELLANEOUS) ×1
MAT ABSORB FLUID 56X50 GRAY (MISCELLANEOUS) ×1 IMPLANT
NDL SAFETY ECLIPSE 18X1.5 (NEEDLE) ×1 IMPLANT
NEEDLE HYPO 18GX1.5 SHARP (NEEDLE) ×2
NEEDLE SPNL 20GX3.5 QUINCKE YW (NEEDLE) ×4 IMPLANT
NS IRRIG 1000ML POUR BTL (IV SOLUTION) ×2 IMPLANT
PACK HIP COMPR (MISCELLANEOUS) ×2 IMPLANT
SCALPEL PROTECTED #10 DISP (BLADE) ×4 IMPLANT
SHELL ACETABULAR SZ0 58MM (Shell) ×2 IMPLANT
SOL PREP PVP 2OZ (MISCELLANEOUS) ×2
SOLUTION PREP PVP 2OZ (MISCELLANEOUS) ×1 IMPLANT
SPONGE DRAIN TRACH 4X4 STRL 2S (GAUZE/BANDAGES/DRESSINGS) ×2 IMPLANT
SPONGE T-LAP 18X18 ~~LOC~~+RFID (SPONGE) ×4 IMPLANT
STAPLER SKIN PROX 35W (STAPLE) ×2 IMPLANT
STRAP SAFETY 5IN WIDE (MISCELLANEOUS) ×2 IMPLANT
SUT DVC 2 QUILL PDO  T11 36X36 (SUTURE) ×1
SUT DVC 2 QUILL PDO T11 36X36 (SUTURE) ×1 IMPLANT
SUT SILK 0 (SUTURE) ×2
SUT SILK 0 30XBRD TIE 6 (SUTURE) ×1 IMPLANT
SUT V-LOC 90 ABS DVC 3-0 CL (SUTURE) ×2 IMPLANT
SUT VIC AB 1 CT1 36 (SUTURE) ×2 IMPLANT
SYR 20ML LL LF (SYRINGE) ×2 IMPLANT
SYR 30ML LL (SYRINGE) ×2 IMPLANT
SYR 50ML LL SCALE MARK (SYRINGE) ×4 IMPLANT
SYR BULB IRRIG 60ML STRL (SYRINGE) ×2 IMPLANT
TAPE MICROFOAM 4IN (TAPE) ×2 IMPLANT
TOWEL OR 17X26 4PK STRL BLUE (TOWEL DISPOSABLE) ×2 IMPLANT
TRAY FOLEY MTR SLVR 16FR STAT (SET/KITS/TRAYS/PACK) ×2 IMPLANT

## 2020-08-24 NOTE — Anesthesia Procedure Notes (Signed)
Spinal  Patient location during procedure: OR Start time: 08/24/2020 1:20 PM End time: 08/24/2020 1:29 PM Reason for block: surgical anesthesia Preanesthetic Checklist Completed: patient identified, IV checked, site marked, risks and benefits discussed, surgical consent, monitors and equipment checked, pre-op evaluation and timeout performed Spinal Block Patient position: sitting Prep: DuraPrep Patient monitoring: heart rate, cardiac monitor, continuous pulse ox and blood pressure Approach: midline Location: L3-4 Injection technique: single-shot Needle Needle type: Sprotte  Needle gauge: 24 G Needle length: 9 cm Assessment Events: CSF return

## 2020-08-24 NOTE — H&P (Signed)
Chief Complaint  Patient presents with   Pre-op Exam  Right THA 08/24/20 by Dr. Rudene Christians    History of the Present Illness: Jordan Mason is a 75 y.o. male here today for history and physical for right total hip arthroplasty with Dr. Hessie Knows on 08/24/2020. Patient has advanced right hip osteoarthritis. He underwent right hip intra-articular cortisone injection back in April 2022 with only several hours of complete relief following the injection and then 15% relief of hip pain for couple weeks following the injection. Patient describes groin and lateral hip pain this been present for several years that is increased with weightbearing activity. He has a hard time walking, playing golf and gardening due to groin and lateral hip pain secondary to his right hip osteoarthritis. Patient has had a successful right total knee arthroplasty. He states his right leg is a little longer than his left leg. Patient has been taking Celebrex with no relief.  No history of blood clots  The patient is a musician, and he has an engagement planned to perform on 08/21/2020 in [place].  I have reviewed past medical, surgical, social and family history, and allergies as documented in the EMR.  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 Redo sternotomy for repair Type A Dissection with ascending aortic graft, aortic valve resuspension, aortic  valve central plication of non-coronary cusp, STJ remodeling performed on 08/14/2012 with Dr. Cheree Ditto   Hypertension   Hypotestosteronism   Low back pain   Neuropathy  lower extremity   Presence of stent in coronary artery 06/08/2014  BMS to RCA   PVD (peripheral vascular disease) (CMS-HCC)   Right knee pain   Sleep apnea  no CPAP. needs retesting since weight loss   Vision abnormalities   Past Surgical History: Past Surgical History:  Procedure Laterality Date   BACK SURGERY  triple fusion - then hardware removed one year later   back surgery with removal of hardware decompression, L2-L3 09/2005   BLEPHAROPLASTY UPPER EYELID Bilateral 05/06/2019  Procedure: BLEPHAROPLASTY, UPPER EYELID; WITH EXCESSIVE SKIN WEIGHTING DOWN LID; Surgeon: Lucilla Lame, MD; Location: [facility]; Service: Ophthalmology; Laterality: Bilateral;   CARDIAC CATHETERIZATION  2 open heart; stents; aneurysm adscending aorta   COLONOSCOPY   CORONARY ARTERY BYPASS GRAFT 03/27/11  x4   EGD N/A 03/25/2013  Procedure: EGD; Surgeon: Sherri Rad, MD; Location: [facility]; Service: Gastroenterology; Laterality: N/A;   Electroconvulsive treatment  multiple   Excision of left varicocele 12/2000   EXTRACTION CATARACT EXTRACAPSULAR W/INSERTION INTRAOCULAR PROSTHESIS Right 03/04/2019  Procedure: R- EXTRACAPSULAR CATARACT PHACO REMOVAL WITH INSERTION OF INTRAOCULAR LENS PROSTHESIS (1 STAGE PROCEDURE), MANUAL OR MECHANICAL TECHNIQUE; Surgeon: Christean Grief, MD; Location: [facility]; Service: Ophthalmology; Laterality: Right; incision approximated   EXTRACTION CATARACT INTRACAPSULAR W/INSERTION INTRAOCULAR PROSTHESIS Left 02/18/2019  Procedure: L- DEXTENZA- EXTRACAPSULAR CATARACT ECCE REMOVAL WITH INSERTION OF INTRAOCULAR LENS PROSTHESIS (1 STAGE  PROCEDURE), MANUAL OR MECHANICAL TECHNIQUE; Surgeon: Christean Grief, MD; Location: [facility]; Service: Ophthalmology; Laterality: Left;   EYE SURGERY  Bilateral  cataracts   JOINT REPLACEMENT Right  total knee   KNEE ARTHROSCOPY x 2  Right knee   Lumbar laminectomy with fusion L4-L5, L5-S1 02/2004   REOPERATION ON HEART N/A 08/14/2012  Procedure: REOPERATION ON HEART, s/p cabg 03/2011; Surgeon: Pamala Duffel, MD; Location: [facility]; Service: Cardiothoracic; Laterality: N/A;   REPAIR ASCENDING AORTA ANEURYSM N/A 08/14/2012  Procedure: REPAIR ASCENDING AORTA ANEURYSM; Surgeon: Pamala Duffel, MD; Location: [facility]; Service: Cardiothoracic; Laterality: N/A;   REPAIR BROW PTOSIS Bilateral 05/06/2019  Procedure: Bilateral upper eyelid blepharoplasty and brow ptosis repair; Surgeon: Lucilla Lame, MD; Location: [facility]; Service: Ophthalmology; Laterality: Bilateral;   Revision right total knee arthroplasty 11/2009   Right unicompartmental knee arthroplasty, 02/2008   Status post ilioinguinal nerve block  (x 2 left, x 1 right).   Stent to right external iliac artery 03/2006   Surgical resection of right sinus soft tissue mass   TONSILLECTOMY   Past Family History: Family History  Problem Relation Age of Onset   Coronary Artery Disease (Blocked arteries around heart) Father   Alzheimer's disease Father   Prostate cancer Father   Other Father  pernicious anemia   Dementia Father   Depression Father   Breast cancer Mother   Stroke Mother   Depression Mother   Other Sister  [place]   Coronary Artery Disease (Blocked arteries around heart) Paternal Grandmother   Schizophrenia Paternal Uncle   Anesthesia problems Neg Hx   Colon cancer Neg Hx   Colon polyps Neg Hx   Medications: Current Outpatient Medications Ordered in Epic  Medication Sig Dispense Refill   AUSTEDO 6 mg Tab take ONE oral TABLET TWICE DAILY   benazepriL (LOTENSIN) 20 MG tablet Take 1 tablet (20 mg total) by mouth nightly 90 tablet 3   budesonide-formoteroL (SYMBICORT) 80-4.5 mcg/actuation inhaler Inhale 2 inhalations into the  lungs 2 (two) times daily 10.2 g 12   celecoxib (CELEBREX) 200 MG capsule TAKE 1 CAPSULE BY MOUTH TWICE DAILY 180 capsule 3   cholecalciferol (VITAMIN D3) 2,000 unit tablet Take 2,000 Units by mouth nightly.    dextroamphetamine-amphetamine (ADDERALL) 10 mg tablet Take 10 mg by mouth 2 (two) times daily   diltiazem (TIAZAC) 360 MG ER capsule Take 1 capsule (360 mg total) by mouth once daily 90 capsule 3   escitalopram oxalate (LEXAPRO) 20 MG tablet Take 20 mg by mouth nightly   evolocumab 140 mg/mL PnIj Inject 140 mg subcutaneously every 14 (fourteen) days 2 mL 11   fluticasone (FLONASE) 50 mcg/actuation nasal spray Place 2 sprays into both nostrils every morning   icosapent ethyL (VASCEPA) 1 gram capsule Take 2 capsules (2 g total) by mouth 2 (two) times daily 360 capsule 3   LATUDA 40 mg tablet Take 40 mg by mouth nightly   mometasone (NASONEX) 50 mcg/actuation nasal spray Place 1 spray into both nostrils every morning   multivitamin capsule Take 1 capsule by mouth nightly   pantoprazole (PROTONIX) 40 MG DR tablet Take 40 mg by mouth once daily   polyethylene glycol (MIRALAX) powder Take 17 g by mouth every morning Mix in 4-8ounces of fluid prior to taking.   PROAIR HFA 90 mcg/actuation inhaler INHALE 2 PUFFS BY MOUTH EVERY 6 HOURS AS NEEDED 1 Inhaler 12   testosterone cypionate (DEPO-TESTOSTERONE) 200 mg/mL injection Inject 200 mg into the  muscle every 14 (fourteen) days As needed sometimes alternates w/ weekly dose - last dose3/01/2020   tiotropium (SPIRIVA) 18 mcg inhalation capsule Place 18 mcg into inhaler and inhale every morning   No current Epic-ordered facility-administered medications on file.   Allergies: Allergies  Allergen Reactions   Brintellix [Vortioxetine] Other (See Comments)  Serotonin syndrome   Atorvastatin Muscle Pain  Other reaction(s): Muscle Pain   Morphine Nausea   Pravastatin Muscle Pain  Other reaction(s): Muscle Pain   Rosuvastatin Muscle Pain  Other  reaction(s): Muscle Pain   Wellbutrin [Bupropion Hcl] Other (See Comments)  vision problem   Dyazide [Triamterene-Hydrochlorothiazid] Other (See Comments)  Other Reaction: leg cramps   Felodipine Other (See Comments)  Other Reaction: sexual dysfunction   Lisinopril Other (See Comments)  Other Reaction: jitteriness   Toprol Xl [Metoprolol Succinate] Other (See Comments)  Other Reaction: sexual dys, abn tongue sens.    Body mass index is 25.4 kg/m.  Review of Systems: A comprehensive 14 point ROS was performed, reviewed, and the pertinent orthopaedic findings are documented in the HPI.  Vitals:  08/20/20 1015  BP: (!) 146/80    General Physical Examination:  General:  Well developed, well nourished, no apparent distress, normal affect, normal gait with no antalgic component.   HEENT: Head normocephalic, atraumatic, PERRL.   Abdomen: Soft, non tender, non distended, Bowel sounds present.  Heart: Examination of the heart reveals regular, rate, and rhythm. There is no murmur noted on ascultation. There is a normal apical pulse.  Lungs: Lungs are clear to auscultation. There is no wheeze, rhonchi, or crackles. There is normal expansion of bilateral chest walls.   Musculoskeletal Examination: On exam, stable gait. Left hip internal rotation is approximately 40 degrees, external is 60 degrees with pain. Right hip internal rotation is 30 degrees, external is 40 degrees with pain in the groin. Right hip abduction is 30 degrees, abduction is 10 degrees with pain. No flexion contracture.  Radiographs:  X-rays of the right hip reviewed by me today from 05/25/2020 show advanced right hip osteoarthritis with near complete loss of joint space and superior acetabulum with sclerotic changes and superior acetabular spur. There is inferior acetabular spurring with central joint space narrowing. Large cam lesion noted. No evidence of acute bony abnormality  Assessment: ICD-10-CM  1.  Primary osteoarthritis of right hip M16.11   Plan: 13. 75 year old male with advanced right hip osteoarthritis. Pain progressively increasing and interfering with quality of life and activity living. He has had no relief conservative treatment consisting of cortisone injection and anti-inflammatory medications. Risks, benefits, complications of right total hip arthroplasty have discussed with the patient. Patient has agreed and consented procedure with Dr. Hessie Knows on 08/24/2020.   Electronically signed by Feliberto Gottron, [place] at 08/20/2020 10:26 AM EDT  Reviewed  H+P. No changes noted.

## 2020-08-24 NOTE — Progress Notes (Signed)
Prayer emotional support. Met with patient and his spouse. Patient has [place].

## 2020-08-24 NOTE — Anesthesia Preprocedure Evaluation (Signed)
Anesthesia Evaluation  Patient identified by MRN, date of birth, ID band Patient awake    Reviewed: Allergy & Precautions, NPO status , Patient's Chart, lab work & pertinent test results  History of Anesthesia Complications Negative for: history of anesthetic complications  Airway Mallampati: II  TM Distance: >3 FB Neck ROM: Full    Dental no notable dental hx.    Pulmonary asthma , neg sleep apnea, former smoker,    breath sounds clear to auscultation- rhonchi (-) wheezing      Cardiovascular Exercise Tolerance: Good hypertension, + CAD, + Past MI, + Cardiac Stents and + Peripheral Vascular Disease (s/p thoracic aneurysm repair)   Rhythm:Regular Rate:Normal - Systolic murmurs and - Diastolic murmurs    Neuro/Psych neg Seizures PSYCHIATRIC DISORDERS Anxiety Depression Dementia    GI/Hepatic Neg liver ROS, GERD  ,  Endo/Other  negative endocrine ROSneg diabetes  Renal/GU Renal InsufficiencyRenal disease     Musculoskeletal  (+) Arthritis ,   Abdominal (+) - obese,   Peds  Hematology  (+) anemia ,   Anesthesia Other Findings Past Medical History: No date: Anemia No date: Anxiety No date: Aortic valve disease No date: Arthritis No date: Asthma No date: Candidal esophagitis (HCC) No date: Chronic back pain No date: Coronary artery disease No date: GERD (gastroesophageal reflux disease) 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[place]) No date: Hx of CABG No date: Hyperlipidemia No date: Hypertension 04/03/2006: Iliac artery stenosis, bilateral (HCC)     Comment:  a.)  s/p RIGHT sided PTA and stenting No date: MDD (major depressive disorder) 02/21/2006: Myocardial infarction ([place]) No date: Neuropathy No date: OSA (obstructive sleep apnea) No date: Peripheral vascular disease ([place]) 09/21/2011: Postlaminectomy syndrome of lumbar region No date: Postoperative atrial fibrillation (HCC) No date: Statin intolerance No date:  Thoracic aortic aneurysm (HCC)     Comment:  s/p dissection and repair in 08/2012   Reproductive/Obstetrics                             Lab Results  Component Value Date   WBC 14.2 (H) 08/12/2020   HGB 15.8 08/12/2020   HCT 44.6 08/12/2020   MCV 92.5 08/12/2020   PLT 395 08/12/2020    Anesthesia Physical Anesthesia Plan  ASA: 3  Anesthesia Plan: Spinal   Post-op Pain Management:    Induction:   PONV Risk Score and Plan: 1 and Propofol infusion  Airway Management Planned: Natural Airway  Additional Equipment:   Intra-op Plan:   Post-operative Plan:   Informed Consent: I have reviewed the patients History and Physical, chart, labs and discussed the procedure including the risks, benefits and alternatives for the proposed anesthesia with the patient or authorized representative who has indicated his/her understanding and acceptance.     Dental advisory given  Plan Discussed with: CRNA and Anesthesiologist  Anesthesia Plan Comments:         Anesthesia Quick Evaluation

## 2020-08-24 NOTE — Op Note (Signed)
08/24/2020  2:58 PM  PATIENT:  Jordan Mason  75 y.o. male  PRE-OPERATIVE DIAGNOSIS:  Primary osteoarthritis of right hip M16.11  POST-OPERATIVE DIAGNOSIS:  Primary osteoarthritis of right hip M16.11  PROCEDURE:  Procedure(s): TOTAL HIP ARTHROPLASTY ANTERIOR APPROACH (Right)  SURGEON: Laurene Footman, MD  ASSISTANTS: None  ANESTHESIA:   spinal  EBL:  Total I/O In: 100 [IV Piggyback:100] Out: -   BLOOD ADMINISTERED:none  DRAINS:  Incisional wound VAC    LOCAL MEDICATIONS USED:  MARCAINE    and OTHER Exparel  SPECIMEN: Right femoral head  DISPOSITION OF SPECIMEN:  PATHOLOGY  COUNTS:  YES  TOURNIQUET:  * No tourniquets in log *  IMPLANTS: Medacta Masterloc 6 lateralized with 58 mm Mpact DM cup and liner with metal S 28 mm head  DICTATION: .Dragon Dictation  The patient was brought to the operating room and after spinal anesthesia was obtained patient was placed on the operative table with the ipsilateral foot into the Medacta attachment, contralateral leg on a well-padded table. C-arm was brought in and preop template x-ray taken. After prepping and draping in usual sterile fashion appropriate patient identification and timeout procedures were completed. Anterior approach to the hip was obtained and centered over the greater trochanter and TFL muscle. The subcutaneous tissue was incised hemostasis being achieved by electrocautery. TFL fascia was incised and the muscle retracted laterally deep retractor placed. The lateral femoral circumflex vessels were identified and ligated. The anterior capsule was exposed and a capsulotomy performed. The neck was identified and a femoral neck cut carried out with a saw. The head was removed without difficulty and showed sclerotic femoral head and acetabulum. Reaming was carried out to 58 mm and a 58 mm cup trial gave appropriate tightness to the acetabular component a 58 DM 56 cup was impacted into position. The leg was then externally rotated  and ischiofemoral and pubofemoral releases carried out. The femur was sequentially broached to a size 6, size 6 lateralized stem with S head trials were placed and the final components chosen. The 6 lateralized stem was inserted along with a S metal 28 mm head and 58 mm liner. The hip was reduced and was stable the wound was thoroughly irrigated with fibrillar placed along the posterior capsule and medial neck. The deep fascia ws closed using a heavy Quill after infiltration of 30 cc of quarter percent Sensorcaine with epinephrine.3-0 diluted with Exparel throughout the case, V-loc to close the skin with skin staples.  Incisional wound VAC applied and patient was sent to recovery in stable condition.   PLAN OF CARE: Admit to inpatient

## 2020-08-24 NOTE — Transfer of Care (Signed)
Immediate Anesthesia Transfer of Care Note  Patient: Jordan Mason  Procedure(s) Performed: TOTAL HIP ARTHROPLASTY ANTERIOR APPROACH (Right: Hip)  Patient Location: PACU  Anesthesia Type:MAC and Spinal  Level of Consciousness: awake, alert  and oriented  Airway & Oxygen Therapy: Patient Spontanous Breathing  Post-op Assessment: Report given to RN and Post -op Vital signs reviewed and stable  Post vital signs: Reviewed and stable  Last Vitals:  Vitals Value Taken Time  BP 107/64 08/24/20 1500  Temp    Pulse 73 08/24/20 1502  Resp 25 08/24/20 1502  SpO2 98 % 08/24/20 1502  Vitals shown include unvalidated device data.  Last Pain:  Vitals:   08/24/20 1104  TempSrc:   PainSc: 5          Complications: No notable events documented.

## 2020-08-25 ENCOUNTER — Encounter: Payer: Self-pay | Admitting: Orthopedic Surgery

## 2020-08-25 DIAGNOSIS — Z96649 Presence of unspecified artificial hip joint: Secondary | ICD-10-CM

## 2020-08-25 DIAGNOSIS — M1611 Unilateral primary osteoarthritis, right hip: Secondary | ICD-10-CM | POA: Diagnosis not present

## 2020-08-25 LAB — BASIC METABOLIC PANEL
Anion gap: 3 — ABNORMAL LOW (ref 5–15)
BUN: 18 mg/dL (ref 8–23)
CO2: 25 mmol/L (ref 22–32)
Calcium: 8.2 mg/dL — ABNORMAL LOW (ref 8.9–10.3)
Chloride: 110 mmol/L (ref 98–111)
Creatinine, Ser: 0.95 mg/dL (ref 0.61–1.24)
GFR, Estimated: >60 mL/min (ref >60)
Glucose, Bld: 116 mg/dL — ABNORMAL HIGH (ref 70–99)
Potassium: 4.2 mmol/L (ref 3.5–5.1)
Sodium: 138 mmol/L (ref 135–145)

## 2020-08-25 LAB — CBC
HCT: 37.9 % — ABNORMAL LOW (ref 39.0–52.0)
Hemoglobin: 13.3 g/dL (ref 13.0–17.0)
MCH: 32.8 pg (ref 26.0–34.0)
MCHC: 35.1 g/dL (ref 30.0–36.0)
MCV: 93.6 fL (ref 80.0–100.0)
Platelets: 303 10*3/uL (ref 150–400)
RBC: 4.05 MIL/uL — ABNORMAL LOW (ref 4.22–5.81)
RDW: 12.7 % (ref 11.5–15.5)
WBC: 11.8 10*3/uL — ABNORMAL HIGH (ref 4.0–10.5)
nRBC: 0 % (ref 0.0–0.2)

## 2020-08-25 LAB — GLUCOSE, CAPILLARY: Glucose-Capillary: 131 mg/dL — ABNORMAL HIGH (ref 70–99)

## 2020-08-25 MED ORDER — TRAMADOL HCL 50 MG PO TABS: 50.0000 mg | ORAL_TABLET | Freq: Four times a day (QID) | ORAL | 0 refills | Status: AC

## 2020-08-25 MED ORDER — HYDROCODONE-ACETAMINOPHEN 5-325 MG PO TABS: 1.0000 | ORAL_TABLET | ORAL | 0 refills | Status: DC | PRN

## 2020-08-25 MED ORDER — ENOXAPARIN SODIUM 40 MG/0.4ML IJ SOSY: 40.0000 mg | PREFILLED_SYRINGE | INTRAMUSCULAR | 0 refills | Status: DC

## 2020-08-25 NOTE — Progress Notes (Signed)
DISCHARGE NOTE:  Pt given discharge instructions and scripts. Provina wound vac connected, extra honeycomb dressings sent with pt. Pt wheeled to car by staff. Wife providing transportation.

## 2020-08-25 NOTE — TOC Transition Note (Signed)
Transition of Care [facility]) - CM/SW Discharge Note   Patient Details  Name: Jordan Mason MRN: [id number] Date of Birth: 1945/03/10  [facility] CM/SW Contact:  Pete Pelt, RN Phone Number: 08/25/2020, 10:23 AM   Clinical Narrative:   Patient lives at home with spouse, who can help him if needed.  Patient has no concerns with getting to appointments or getting medications, states he takes medications as directed.  Patient has a walker at home, will need 3n1.  Ordered with [employer]. Patient states he has no concerns about returning home after discharge.  TOC contact information given.    Final next level of care: [place] Barriers to Discharge: Continued Medical Work up   Patient Goals and CMS Choice Patient states their goals for this hospitalization and ongoing recovery are:: To go home and rest      Discharge Placement                       Discharge Plan and Services   Discharge Planning Services: CM Consult Post Acute Care Choice: Home Health          DME Arranged: 3-N-1 DME Agency: [facility] Date DME Agency Contacted: 08/25/20   Representative spoke with at DME Agency: Suanne Marker HH Arranged: PT, OT [place] Agency: [place]     Representative spoke with at [place]: Home health set up prior to admission by MD office.  Confirmed by [place]  Social Determinants of Health (SDOH) Interventions     Readmission Risk Interventions No flowsheet data found.

## 2020-08-25 NOTE — Progress Notes (Signed)
  Subjective: 1 Day Post-Op Procedure(s) (LRB): TOTAL HIP ARTHROPLASTY ANTERIOR APPROACH (Right) Patient reports pain as moderate.   Patient is well, and has had no acute complaints or problems Plan is to go Home after hospital stay. Negative for chest pain and shortness of breath Fever: no Gastrointestinal: Negative for nausea and vomiting  Objective: Vital signs in last 24 hours: Temp:  [96.6 F (35.9 C)-98.6 F (37 C)] 98 F (36.7 C) (07/13 0405) Pulse Rate:  [58-81] 58 (07/13 0405) Resp:  [10-18] 16 (07/13 0405) BP: (107-159)/(64-94) 126/80 (07/13 0405) SpO2:  [95 %-100 %] 97 % (07/13 0405) Weight:  [77.1 kg] 77.1 kg (07/12 1104)  Intake/Output from previous day:  Intake/Output Summary (Last 24 hours) at 08/25/2020 0657 Last data filed at 08/25/2020 0409 Gross per 24 hour  Intake 1100 ml  Output 1750 ml  Net -650 ml    Intake/Output this shift: Total I/O In: -  Out: 1600 [Urine:1600]  Labs: Recent Labs    08/24/20 1720 08/25/20 0421  HGB 13.6 13.3   Recent Labs    08/24/20 1720 08/25/20 0421  WBC 19.7* 11.8*  RBC 3.97* 4.05*  HCT 37.6* 37.9*  PLT 333 303   Recent Labs    08/24/20 1720 08/25/20 0421  NA  --  138  K  --  4.2  CL  --  110  CO2  --  25  BUN  --  18  CREATININE 1.13 0.95  GLUCOSE  --  116*  CALCIUM  --  8.2*   No results for input(s): LABPT, INR in the last 72 hours.   EXAM General - Patient is Alert and Oriented Extremity - Neurovascular intact Sensation intact distally Dorsiflexion/Plantar flexion intact Compartment soft Dressing/Incision - clean, dry, with the wound VAC in place Motor Function - intact, moving foot and toes well on exam.   Past Medical History:  Diagnosis Date   Anemia    Anxiety    Aortic valve disease    Arthritis    Asthma    Candidal esophagitis (HCC)    Chronic back pain    Coronary artery disease    GERD (gastroesophageal reflux disease)    Huntington disease (HCC)    Hx of CABG     Hyperlipidemia    Hypertension    Iliac artery stenosis, bilateral ([place]) 04/03/2006   a.)  s/p RIGHT sided PTA and stenting   MDD (major depressive disorder)    Myocardial infarction ([place]) 02/21/2006   Neuropathy    OSA (obstructive sleep apnea)    Peripheral vascular disease (HCC)    Postlaminectomy syndrome of lumbar region 09/21/2011   Postoperative atrial fibrillation (HCC)    Statin intolerance    Thoracic aortic aneurysm (HCC)    s/p dissection and repair in 08/2012    Assessment/Plan: 1 Day Post-Op Procedure(s) (LRB): TOTAL HIP ARTHROPLASTY ANTERIOR APPROACH (Right) Active Problems:   Status post total hip replacement, right  Estimated body mass index is 25.1 kg/m as calculated from the following:   Height as of this encounter: 5\' 9"  (1.753 m).   Weight as of this encounter: 77.1 kg. Advance diet Up with therapy D/C IV fluids Discharge home with home health  DVT Prophylaxis - Lovenox, Foot Pumps, and TED hose Weight-Bearing as tolerated to right leg  Reche Dixon, PA-C Orthopaedic Surgery 08/25/2020, 6:57 AM

## 2020-08-25 NOTE — Evaluation (Signed)
Physical Therapy Evaluation Patient Details Name: Jordan Mason MRN: [id number] DOB: [birth year] Today's Date: 08/25/2020   History of Present Illness  Jordan Mason is a 75 y.o. male who presents with advanced R hip hosteoarthrits. he is now s/p R anterior THA. PMH includes: MI, asthma, CAD, PVD, depression, Huntington's disease, & LE neuropathy.  Clinical Impression  Pt was pleasant and motivated to participate during the session. He gave good effort throughout session. Pt required supervision to min guard A during functional activities. Pt demonstrated decreased speed while ambulating but was overall steady with no LOB needing intermittent cues for decreased force through UEs into walker and walker placement. Pt navigated stairs with good eccentric/concentric control with min verbal cues for sequencing and hand placement. Pt reported that all functional activities were easy but did report some increased pain towards end of session. Pt will benefit from HHPT upon discharge to safely address deficits listed in patient problem list for decreased caregiver assistance and eventual return to PLOF.     Follow Up Recommendations Home health PT    Equipment Recommendations  None recommended by PT    Recommendations for Other Services       Precautions / Restrictions Precautions Precautions: Fall;Anterior Hip Precaution Booklet Issued: Yes (comment) Restrictions Weight Bearing Restrictions: Yes RLE Weight Bearing: Weight bearing as tolerated      Mobility  Bed Mobility              General bed mobility comments: NT, in recliner upon arrival    Transfers Overall transfer level: Needs assistance Equipment used: Rolling walker (2 wheeled) Transfers: Sit to/from Stand Sit to Stand: Supervision        General transfer comment: Verbal cueing for hand placement. Good eccentric/concentric control from non-elevated surface.  Ambulation/Gait Ambulation/Gait assistance: Min  guard Gait Distance (Feet): 80 Feet x1, 100 feet x1 Assistive device: Rolling walker (2 wheeled) Gait Pattern/deviations: Step-through pattern;Decreased step length - left;Decreased stance time - right;Decreased stride length Gait velocity: decreased   General Gait Details: Pt required min cueing for less reliance on UEs through walker and towards end of walking for walker placement. He was overall steady with decreased without LOB.  Stairs Stairs: Yes Stairs assistance: Min guard Stair Management: One rail Right;Step to pattern;Forwards Number of Stairs: 4 (x2) General stair comments: Pt was steady ascendeding/descendeding stairs requiring verbal cues for sequencing and slowing down.  Wheelchair Mobility    Modified Rankin (Stroke Patients Only)       Balance Overall balance assessment: Needs assistance Sitting-balance support: Feet supported Sitting balance-Leahy Scale: Normal     Standing balance support: Bilateral upper extremity supported;No upper extremity supported;During functional activity Standing balance-Leahy Scale: Fair Standing balance comment: Reliance on UEs through walker while ambulating. Pt able to maintain balance with UE support for >10sec.                             Pertinent Vitals/Pain Pain Assessment: 0-10 Pain Score: 8  (8/10 post physical activity and 6/10 at rest) Pain Location: R hip Pain Descriptors / Indicators: Sharp Pain Intervention(s): Limited activity within patient's tolerance;Monitored during session;Premedicated before session    Home Living Family/patient expects to be discharged to:: Private residence Living Arrangements: Spouse/significant other;Other (Comment) (Mother in law) Available Help at Discharge: Family;Available 24 hours/day Type of Home: House Home Access: Stairs to enter Entrance Stairs-Rails:  (R sided rail at front; bilateral rail at basement) Entrance Stairs-Number of Steps:  8 at front, 14 at basement  level (typically uses basement level) Home Layout: One level;Laundry or work area in [place]: Environmental consultant - 2 wheels;Cane - single point;Bedside commode Additional Comments: Gilford Rile belongs to [employer], but pt reports she is not currently using it.    Prior Function Level of Independence: Independent         Comments: Pt reports he is active and independent at baseline. No falls history, denies AE use for functional mobility. Enjoys golfing & gardening.     Hand Dominance   Dominant Hand: Left    Extremity/Trunk Assessment   Upper Extremity Assessment Upper Extremity Assessment: Overall WFL for tasks assessed    Lower Extremity Assessment Lower Extremity Assessment: Generalized weakness;RLE deficits/detail RLE Deficits / Details: s/p R THA RLE: Unable to fully assess due to pain     Cervical / Trunk Assessment Cervical / Trunk Assessment: Normal  Communication   Communication: No difficulties  Cognition Arousal/Alertness: Awake/alert Behavior During Therapy: WFL for tasks assessed/performed Overall Cognitive Status: Within Functional Limits for tasks assessed                                        General Comments     Exercises Total Joint Exercises Ankle Circles/Pumps: AROM;Both;10 reps;Seated;Other (comment) (in reclined chair) Quad Sets: AROM;Both;10 reps;Seated;Other (comment) (in reclined chair) Gluteal Sets: AROM;Both;10 reps;Seated;Other (comment) (in reclined chair) Towel Squeeze: AROM;Both;10 reps;Seated (with pillow) Long Arc Quad: AROM;Both;10 reps;Seated Marching in Standing: AROM;Both;10 reps;Standing Other Exercises Other Exercises: HEP education via hand out and verbal explanation. Other Exercises: Car transfer training verbally and via simulation with spouse present. Other Exercises: Stair navigation training verbally and via demonstration and pt demonstrated understanding. Other Exercises: Anterior hip precautions education  provided verbally.   Assessment/Plan    PT Assessment Patient needs continued PT services  PT Problem List Decreased strength;Decreased activity tolerance;Decreased balance;Decreased mobility;Decreased knowledge of use of DME;Decreased safety awareness;Decreased knowledge of precautions;Pain       PT Treatment Interventions DME instruction;Gait training;Stair training;Functional mobility training;Therapeutic activities;Therapeutic exercise;Balance training;Patient/family education    PT Goals (Current goals can be found in the Care Plan section)  Acute Rehab PT Goals Patient Stated Goal: Stand, walk, and twist without pain. PT Goal Formulation: With patient Time For Goal Achievement: 09/07/20 Potential to Achieve Goals: Good    Frequency BID   Barriers to discharge        Co-evaluation               AM-PAC PT "6 Clicks" Mobility  Outcome Measure Help needed turning from your back to your side while in a flat bed without using bedrails?: None Help needed moving from lying on your back to sitting on the side of a flat bed without using bedrails?: A Little Help needed moving to and from a bed to a chair (including a wheelchair)?: A Little Help needed standing up from a chair using your arms (e.g., wheelchair or bedside chair)?: A Little Help needed to walk in hospital room?: A Little Help needed climbing 3-5 steps with a railing? : A Little 6 Click Score: 19    End of Session Equipment Utilized During Treatment: Gait belt Activity Tolerance: Patient tolerated treatment well Patient left: in chair;with call bell/phone within reach;with chair alarm set;with SCD's reapplied;with family/visitor present Nurse Communication: Mobility status;Weight bearing status PT Visit Diagnosis: Other abnormalities of gait and mobility (R26.89);Muscle weakness (  generalized) (M62.81);Pain Pain - Right/Left: Right Pain - part of body: Hip    Time: 6812-7517 PT Time Calculation (min)  (ACUTE ONLY): 42 min   Charges:              Dayton Scrape SPT 08/25/20, 1:30 PM

## 2020-08-25 NOTE — Discharge Instructions (Signed)
ANTERIOR APPROACH TOTAL HIP REPLACEMENT POSTOPERATIVE DIRECTIONS   Hip Rehabilitation, Guidelines Following Surgery  The results of a hip operation are greatly improved after range of motion and muscle strengthening exercises. Follow all safety measures which are given to protect your hip. If any of these exercises cause increased pain or swelling in your joint, decrease the amount until you are comfortable again. Then slowly increase the exercises. Call your caregiver if you have problems or questions.   HOME CARE INSTRUCTIONS  Remove items at home which could result in a fall. This includes throw rugs or furniture in walking pathways.  ICE to the affected hip every three hours for 30 minutes at a time and then as needed for pain and swelling.  Continue to use ice on the hip for pain and swelling from surgery. You may notice swelling that will progress down to the foot and ankle.  This is normal after surgery.  Elevate the leg when you are not up walking on it.   Continue to use the breathing machine which will help keep your temperature down.  It is common for your temperature to cycle up and down following surgery, especially at night when you are not up moving around and exerting yourself.  The breathing machine keeps your lungs expanded and your temperature down. Do not place pillow under knee, focus on keeping the knee straight while resting  DIET You may resume your previous home diet once your are discharged from the hospital.  DRESSING / WOUND CARE / SHOWERING Keep the wound VAC on for the next 5 to 7 days.  If the wound VAC begins to sound an alarm, it is ready to be discontinued.  Physical therapy will help change the wound VAC to a dry dressing.  You can shower while the wound VAC is in place.  When it is changed, keep the wound clean and dry with no showering. Keep your dressing dry with showering.  You can keep it covered and pat dry. Change the surgical dressing daily and reapply a  dry dressing each time.  ACTIVITY Walk with your walker as instructed. Use walker as long as suggested by your caregivers. Avoid periods of inactivity such as sitting longer than an hour when not asleep. This helps prevent blood clots.  You may resume a sexual relationship in one month or when given the OK by your doctor.  You may return to work once you are cleared by your doctor.  Do not drive a car for 6 weeks or until released by you surgeon.  Do not drive while taking narcotics.  WEIGHT BEARING Weight bearing as tolerated with assist device (walker, cane, etc) as directed, use it as long as suggested by your surgeon or therapist, typically at least 4-6 weeks.  POSTOPERATIVE CONSTIPATION PROTOCOL Constipation - defined medically as fewer than three stools per week and severe constipation as less than one stool per week.  One of the most common issues patients have following surgery is constipation.  Even if you have a regular bowel pattern at home, your normal regimen is likely to be disrupted due to multiple reasons following surgery.  Combination of anesthesia, postoperative narcotics, change in appetite and fluid intake all can affect your bowels.  In order to avoid complications following surgery, here are some recommendations in order to help you during your recovery period.  Colace (docusate) - Pick up an over-the-counter form of Colace or another stool softener and take twice a day as long as  you are requiring postoperative pain medications.  Take with a full glass of water daily.  If you experience loose stools or diarrhea, hold the colace until you stool forms back up.  If your symptoms do not get better within 1 week or if they get worse, check with your doctor.  Dulcolax (bisacodyl) - Pick up over-the-counter and take as directed by the product packaging as needed to assist with the movement of your bowels.  Take with a full glass of water.  Use this product as needed if not  relieved by Colace only.   MiraLax (polyethylene glycol) - Pick up over-the-counter to have on hand.  MiraLax is a solution that will increase the amount of water in your bowels to assist with bowel movements.  Take as directed and can mix with a glass of water, juice, soda, coffee, or tea.  Take if you go more than two days without a movement. Do not use MiraLax more than once per day. Call your doctor if you are still constipated or irregular after using this medication for 7 days in a row.  If you continue to have problems with postoperative constipation, please contact the office for further assistance and recommendations.  If you experience "the worst abdominal pain ever" or develop nausea or vomiting, please contact the office immediatly for further recommendations for treatment.  ITCHING  If you experience itching with your medications, try taking only a single pain pill, or even half a pain pill at a time.  You can also use Benadryl over the counter for itching or also to help with sleep.   TED HOSE STOCKINGS Wear the elastic stockings on both legs for three weeks following surgery during the day but you may remove then at night for sleeping.  MEDICATIONS See your medication summary on the "After Visit Summary" that the nursing staff will review with you prior to discharge.  You may have some home medications which will be placed on hold until you complete the course of blood thinner medication.  It is important for you to complete the blood thinner medication as prescribed by your surgeon.  Continue your approved medications as instructed at time of discharge.  PRECAUTIONS If you experience chest pain or shortness of breath - call 911 immediately for transfer to the hospital emergency department.  If you develop a fever greater that 101 F, purulent drainage from wound, increased redness or drainage from wound, foul odor from the wound/dressing, or calf pain - CONTACT YOUR SURGEON.                                                    FOLLOW-UP APPOINTMENTS Make sure you keep all of your appointments after your operation with your surgeon and caregivers. You should call the office at the above phone number and make an appointment for approximately two weeks after the date of your surgery or on the date instructed by your surgeon outlined in the "After Visit Summary".  RANGE OF MOTION AND STRENGTHENING EXERCISES  These exercises are designed to help you keep full movement of your hip joint. Follow your caregiver's or physical therapist's instructions. Perform all exercises about fifteen times, three times per day or as directed. Exercise both hips, even if you have had only one joint replacement. These exercises can be done on a training (exercise)  mat, on the floor, on a table or on a bed. Use whatever works the best and is most comfortable for you. Use music or television while you are exercising so that the exercises are a pleasant break in your day. This will make your life better with the exercises acting as a break in routine you can look forward to.  Lying on your back, slowly slide your foot toward your buttocks, raising your knee up off the floor. Then slowly slide your foot back down until your leg is straight again.  Lying on your back spread your legs as far apart as you can without causing discomfort.  Lying on your side, raise your upper leg and foot straight up from the floor as far as is comfortable. Slowly lower the leg and repeat.  Lying on your back, tighten up the muscle in the front of your thigh (quadriceps muscles). You can do this by keeping your leg straight and trying to raise your heel off the floor. This helps strengthen the largest muscle supporting your knee.  Lying on your back, tighten up the muscles of your buttocks both with the legs straight and with the knee bent at a comfortable angle while keeping your heel on the floor.   IF YOU ARE TRANSFERRED TO A  SKILLED REHAB FACILITY If the patient is transferred to a skilled rehab facility following release from the hospital, a list of the current medications will be sent to the facility for the patient to continue.  When discharged from the skilled rehab facility, please have the facility set up the patient's [place] prior to being released. Also, the skilled facility will be responsible for providing the patient with their medications at time of release from the facility to include their pain medication, the muscle relaxants, and their blood thinner medication. If the patient is still at the rehab facility at time of the two week follow up appointment, the skilled rehab facility will also need to assist the patient in arranging follow up appointment in our office and any transportation needs.  MAKE SURE YOU:  Understand these instructions.  Get help right away if you are not doing well or get worse.    Pick up stool softner and laxative for home use following surgery while on pain medications. Do not submerge incision under water. Please use good hand washing techniques while changing dressing each day. May shower starting three days after surgery. Please use a clean towel to pat the incision dry following showers. Continue to use ice for pain and swelling after surgery. Do not use any lotions or creams on the incision until instructed by your surgeon.

## 2020-08-25 NOTE — Discharge Summary (Signed)
Physician Discharge Summary  Subjective: 1 Day Post-Op Procedure(s) (LRB): TOTAL HIP ARTHROPLASTY ANTERIOR APPROACH (Right) Patient reports pain as moderate.   Patient seen in rounds with Dr. Rudene Christians. Patient is well, and has had no acute complaints or problems Patient is ready to go home with home health physical therapy after doing physical therapy today.  Physician Discharge Summary  Patient ID: Jordan Mason MRN: [id number] DOB/AGE: Jun 29, 1945 75 y.o.  Admit date: 08/24/2020 Discharge date: 08/25/2020  Admission Diagnoses:  Discharge Diagnoses:  Active Problems:   Status post total hip replacement, right   Discharged Condition: fair  Hospital Course: The patient is postop day 1 from a right anterior approach total hip replacement.  The patient has moderate pain this morning but did well last night.  He is ready to do physical therapy today.  He is planning to go home with home health physical therapy today.  His wound VAC is in place and causing no issues.  His vitals have remained stable.  Treatments: surgery:  TOTAL HIP ARTHROPLASTY ANTERIOR APPROACH (Right)   SURGEON: Laurene Footman, MD   ASSISTANTS: None   ANESTHESIA:   spinal   EBL:  Total I/O In: 100 [IV Piggyback:100] Out: -    BLOOD ADMINISTERED:none   DRAINS:  Incisional wound VAC     LOCAL MEDICATIONS USED:  MARCAINE    and OTHER Exparel   SPECIMEN: Right femoral head   DISPOSITION OF SPECIMEN:  PATHOLOGY   COUNTS:  YES   TOURNIQUET:  * No tourniquets in log *   IMPLANTS: Medacta Masterloc 6 lateralized with 58 mm Mpact DM cup and liner with metal S 28 mm head  Discharge Exam: Blood pressure 126/80, pulse (!) 58, temperature 98 F (36.7 C), temperature source Oral, resp. rate 16, height 5\' 9"  (1.753 m), weight 77.1 kg, SpO2 97 %.   Disposition: Discharge disposition: 01-Home or Self Care        Allergies as of 08/25/2020       Reactions   Vortioxetine Other (See Comments)   Serotonin  syndrome   Atorvastatin Other (See Comments)    Muscle Pain   Bupropion Other (See Comments)   vision problem   Morphine Nausea Only, Other (See Comments)   "dreams"   Pravastatin Other (See Comments)    Muscle Pain   Rosuvastatin Other (See Comments)   Muscle Pain   Felodipine Other (See Comments)    sexual dysfunction   Hydrochlorothiazide W-triamterene Other (See Comments)   leg cramps   Lisinopril Other (See Comments)    jitteriness   Metoprolol Other (See Comments)   sexual dys, abn tongue sens.        Medication List     TAKE these medications    amphetamine-dextroamphetamine 15 MG tablet Commonly known as: ADDERALL Take 15 tablets by mouth 2 (two) times daily.   benazepril 20 MG tablet Commonly known as: LOTENSIN Take 20 mg by mouth at bedtime.   celecoxib 200 MG capsule Commonly known as: CELEBREX Take 200 mg by mouth 2 (two) times daily.   enoxaparin 40 MG/0.4ML injection Commonly known as: LOVENOX Inject 0.4 mLs (40 mg total) into the skin daily for 14 days.   escitalopram 20 MG tablet Commonly known as: LEXAPRO Take 20 mg by mouth at bedtime.   fluticasone 50 MCG/ACT nasal spray Commonly known as: FLONASE Place 2 sprays into the nose daily.   HYDROcodone-acetaminophen 5-325 MG tablet Commonly known as: NORCO/VICODIN Take 1-2 tablets by mouth every 4 (  four) hours as needed for moderate pain (pain score 4-6).   ibuprofen 200 MG tablet Commonly known as: ADVIL Take 800 mg by mouth daily as needed.   Latuda 40 MG Tabs tablet Generic drug: lurasidone Take 40 mg by mouth at bedtime.   multivitamin with minerals tablet Take 1 tablet by mouth daily.   pantoprazole 40 MG tablet Commonly known as: PROTONIX Take 40 mg by mouth daily.   polyethylene glycol 17 g packet Commonly known as: MIRALAX / GLYCOLAX Take 17 g by mouth daily.   Symbicort 80-4.5 MCG/ACT inhaler Generic drug: budesonide-formoterol Inhale 2 puffs into the lungs 2 (two)  times daily.   testosterone cypionate 200 MG/ML injection Commonly known as: DEPOTESTOSTERONE CYPIONATE Inject 200 mg into the muscle every 14 (fourteen) days.   Tiadylt ER 360 MG 24 hr capsule Generic drug: diltiazem Take 360 mg by mouth at bedtime.   traMADol 50 MG tablet Commonly known as: ULTRAM Take 1 tablet (50 mg total) by mouth every 6 (six) hours.   Vascepa 1 g capsule Generic drug: icosapent Ethyl Take 2 g by mouth 2 (two) times daily.   Vitamin D3 Super Strength 50 MCG (2000 UT) Tabs Generic drug: Cholecalciferol Take 2,000 Units by mouth daily.       ASK your doctor about these medications    Austedo 9 MG Tabs Generic drug: Deutetrabenazine Take 9 mg by mouth 2 (two) times daily.               Durable Medical Equipment  (From admission, onward)           Start     Ordered   08/24/20 1652  DME Walker rolling  Once       Question Answer Comment  Walker: With 5 Inch Wheels   Patient needs a walker to treat with the following condition Status post total hip replacement, right      08/24/20 1651   08/24/20 1652  DME 3 n 1  Once        08/24/20 1651   08/24/20 1652  DME Bedside commode  Once       Question:  Patient needs a bedside commode to treat with the following condition  Answer:  Status post total hip replacement, right   08/24/20 1651            Follow-up Information     Hessie Knows, MD. Go in 2 week(s).   Specialty: Orthopedic Surgery Why: For staple removal Contact information: [place] [address] [phone]                 Signed: Prescott Parma, Rosbel Buckner 08/25/2020, 7:05 AM   Objective: Vital signs in last 24 hours: Temp:  [96.6 F (35.9 C)-98.6 F (37 C)] 98 F (36.7 C) (07/13 0405) Pulse Rate:  [58-81] 58 (07/13 0405) Resp:  [10-18] 16 (07/13 0405) BP: (107-159)/(64-94) 126/80 (07/13 0405) SpO2:  [95 %-100 %] 97 % (07/13 0405) Weight:  [77.1 kg] 77.1 kg  (07/12 1104)  Intake/Output from previous day:  Intake/Output Summary (Last 24 hours) at 08/25/2020 0705 Last data filed at 08/25/2020 0409 Gross per 24 hour  Intake 1100 ml  Output 1750 ml  Net -650 ml    Intake/Output this shift: No intake/output data recorded.  Labs: Recent Labs    08/24/20 1720 08/25/20 0421  HGB 13.6 13.3   Recent Labs    08/24/20 1720 08/25/20 0421  WBC 19.7* 11.8*  RBC 3.97* 4.05*  HCT 37.6* 37.9*  PLT 333 303   Recent Labs    08/24/20 1720 08/25/20 0421  NA  --  138  K  --  4.2  CL  --  110  CO2  --  25  BUN  --  18  CREATININE 1.13 0.95  GLUCOSE  --  116*  CALCIUM  --  8.2*   No results for input(s): LABPT, INR in the last 72 hours.  EXAM: General - Patient is Alert and Oriented Extremity - Neurovascular intact Sensation intact distally Dorsiflexion/Plantar flexion intact Compartment soft Incision - clean, dry, with the wound VAC in place Motor Function -plantarflexion and dorsiflexion are intact.  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   Weight as of this encounter: 77.1 kg. Advance diet Up with therapy D/C IV fluids Discharge home with home health Diet - Regular diet Follow up - in 2 weeks Activity - WBAT Disposition - Home Condition Upon Discharge - Stable DVT Prophylaxis - Lovenox  Reche Dixon, PA-C Orthopaedic Surgery 08/25/2020, 7:05 AM

## 2020-08-25 NOTE — Evaluation (Signed)
Occupational Therapy Evaluation Patient Details Name: Jordan Mason MRN: [id number] DOB: November 20, 1945 Today's Date: 08/25/2020    History of Present Illness Jordan Mason is a 75 y.o. male who presents with advanced R hip hosteoarthrits. he is now s/p R anterior THA. PMH includes: MI, asthma, CAD, PVD, depression, & LE neuropathy.   Clinical Impression   [name] was seen for OT evaluation this date, POD#1 from above surgery. Pt was independent in all ADLs prior to surgery. He denies falls history or use of AE for functional mobility. Pt lives with his spouse in a 1 level home with at least 8 STE. Pt reports enjoying golfing and gardening as primary leisure occupations at baseline. Pt is eager to return to PLOF with less pain and improved safety and independence. Pt currently requires minimal assist for LB dressing and bathing while in seated position due to pain and limited AROM of R hip. Pt instructed in self care skills, falls prevention strategies, home/routines modifications, DME/AE for LB bathing and dressing tasks, and compression stocking mgt strategies. Pt would benefit from additional instruction in self care skills and techniques to help maintain precautions with or without assistive devices to support recall and carryover prior to discharge. Do not anticipate the need for f/u OT services upon hospital DC.      Follow Up Recommendations  No OT follow up    Equipment Recommendations  3 in 1 bedside commode    Recommendations for Other Services       Precautions / Restrictions Precautions Precautions: Fall Restrictions Weight Bearing Restrictions: Yes RLE Weight Bearing: Weight bearing as tolerated      Mobility Bed Mobility Overal bed mobility: Modified Independent             General bed mobility comments: Increased time/effort to perform sup>sit 2/2 pain    Transfers Overall transfer level: Needs assistance Equipment used: Rolling walker (2  wheeled) Transfers: Sit to/from [facility] Sit to Stand: Supervision Stand pivot transfers: Supervision       General transfer comment: Min cueing for safe use of RW    Balance Overall balance assessment: Modified Independent;No apparent balance deficits (not formally assessed)                                         ADL either performed or assessed with clinical judgement   ADL Overall ADL's : Needs assistance/impaired                                       General ADL Comments: Pt functionally limited by increased pain and decreased AROM of his RLE. Requires supervision for safety for bed/functional mobility using RW this date. Able to doff/don B hospital socks given set-up/supervision assist and in cueing for safe use of AE  for LB dressing. Anticipate supervision to MIN A for more exertional ADL management including LB bathing.     Vision Baseline Vision/History: Wears glasses Wears Glasses: At all times Patient Visual Report: No change from baseline       Perception     Praxis      Pertinent Vitals/Pain Pain Assessment: 0-10 Pain Score: 7  Pain Intervention(s): Limited activity within patient's tolerance;RN gave pain meds during session;Patient requesting pain meds-RN notified     Hand Dominance Left  Extremity/Trunk Assessment Upper Extremity Assessment Upper Extremity Assessment: Overall WFL for tasks assessed   Lower Extremity Assessment Lower Extremity Assessment: RLE deficits/detail;Defer to PT evaluation RLE Deficits / Details: s/p R THA RLE Coordination: decreased gross motor;decreased fine motor   Cervical / Trunk Assessment Cervical / Trunk Assessment: Normal   Communication Communication Communication: No difficulties   Cognition Arousal/Alertness: Awake/alert Behavior During Therapy: WFL for tasks assessed/performed Overall Cognitive Status: Within Functional Limits for tasks assessed                                      General Comments  Wound vac intact at start/end of session.    Exercises Other Exercises Other Exercises: Pt educated on role of OT in acute setting, falls prevention strategies for home and hospital, safe use of AE/DME for ADL management, & routines modifications to support safety and functional independence upon hospital DC. Other Exercises: OT facilitates bed/functional mobility and LB dressing task as described in ADL section above.   Shoulder Instructions      Home Living Family/patient expects to be discharged to:: Private residence Living Arrangements: Spouse/significant other;Other (Comment) (MIL)   Type of Home: House Home Access: Stairs to enter [employer] of Steps: 8 at front, 14 at basement level (typiclly uses basement level) Entrance Stairs-Rails: Can reach both;Right;Left Home Layout: One level     Bathroom Shower/Tub: Occupational psychologist: Standard     Home Equipment: Environmental consultant - 2 wheels;Cane - single point   Additional Comments: Gilford Rile belongs to [employer], but pt reports she is not currently using it.      Prior Functioning/Environment Level of Independence: Independent        Comments: Pt reports he is active and independent at baseline. No falls history, denies AE use for functional mobility. Enjoys golfing & gardening.        OT Problem List: Pain;Decreased range of motion;Decreased safety awareness;Impaired balance (sitting and/or standing);Decreased knowledge of use of DME or AE;Decreased knowledge of precautions      OT Treatment/Interventions: Self-care/ADL training;Therapeutic exercise;Therapeutic activities;DME and/or AE instruction;Patient/family education;Balance training    OT Goals(Current goals can be found in the care plan section) Acute Rehab OT Goals Patient Stated Goal: To get back to golfing. OT Goal Formulation: With patient Time For Goal Achievement:  09/08/20 Potential to Achieve Goals: Good ADL Goals Pt Will Perform Lower Body Dressing: with modified independence;sit to/from stand;with adaptive equipment Pt Will Transfer to Toilet: bedside commode;with modified independence;ambulating Pt Will Perform Toileting - Clothing Manipulation and hygiene: sit to/from stand;with modified independence;with adaptive equipment  OT Frequency: Min 1X/week   Barriers to D/C:            Co-evaluation              AM-PAC OT "6 Clicks" Daily Activity     Outcome Measure Help from another person eating meals?: None Help from another person taking care of personal grooming?: None Help from another person toileting, which includes using toliet, bedpan, or urinal?: A Little Help from another person bathing (including washing, rinsing, drying)?: A Little Help from another person to put on and taking off regular upper body clothing?: None Help from another person to put on and taking off regular lower body clothing?: A Little 6 Click Score: 21   End of Session Equipment Utilized During Treatment: Gait belt;Rolling walker  Activity Tolerance: Patient tolerated treatment well  Patient left: in chair;with call bell/phone within reach;with chair alarm set  OT Visit Diagnosis: Other abnormalities of gait and mobility (R26.89);Pain Pain - Right/Left: Right Pain - part of body: Hip                Time: 2883-3744 OT Time Calculation (min): 39 min Charges:  OT General Charges $OT Visit: 1 Visit OT Evaluation $OT Eval Low Complexity: 1 Low OT Treatments $Self Care/Home Management : 23-37 mins  Shara Blazing, M.S., OTR/L Ascom: [phone] 08/25/20, 10:29 AM

## 2020-08-26 ENCOUNTER — Encounter: Payer: Self-pay | Admitting: Orthopedic Surgery

## 2020-08-26 LAB — SURGICAL PATHOLOGY

## 2020-08-26 NOTE — Anesthesia Postprocedure Evaluation (Signed)
Anesthesia Post Note  Patient: Jordan Mason  Procedure(s) Performed: TOTAL HIP ARTHROPLASTY ANTERIOR APPROACH (Right: Hip)  Patient location during evaluation: Nursing Unit Anesthesia Type: Spinal Level of consciousness: oriented and awake and alert Pain management: pain level controlled Vital Signs Assessment: post-procedure vital signs reviewed and stable Respiratory status: spontaneous breathing, respiratory function stable and nonlabored ventilation Cardiovascular status: blood pressure returned to baseline and stable Postop Assessment: no headache and no backache Anesthetic complications: no   No notable events documented.   Last Vitals:  Vitals:   08/25/20 1132 08/25/20 1546  BP: 135/85 (!) 156/78  Pulse: (!) 58 69  Resp: 15   Temp: 37.3 C 36.7 C  SpO2: 97% 98%    Last Pain:  Vitals:   08/25/20 1546  TempSrc: Oral  PainSc: 0-No pain                 Truda Staub

## 2020-12-28 ENCOUNTER — Other Ambulatory Visit: Payer: Self-pay | Admitting: Orthopedic Surgery

## 2020-12-28 DIAGNOSIS — M76891 Other specified enthesopathies of right lower limb, excluding foot: Secondary | ICD-10-CM

## 2021-01-10 ENCOUNTER — Other Ambulatory Visit: Payer: Self-pay | Admitting: Orthopedic Surgery

## 2021-01-10 DIAGNOSIS — M76891 Other specified enthesopathies of right lower limb, excluding foot: Secondary | ICD-10-CM

## 2021-01-11 ENCOUNTER — Other Ambulatory Visit: Payer: Self-pay | Admitting: Orthopedic Surgery

## 2021-01-11 ENCOUNTER — Other Ambulatory Visit: Payer: Self-pay

## 2021-01-11 ENCOUNTER — Ambulatory Visit
Admission: RE | Admit: 2021-01-11 | Discharge: 2021-01-11 | Disposition: A | Discharge status: Home / Self Care | Payer: Medicare Other | Source: Ambulatory Visit | Attending: Orthopedic Surgery | Admitting: Orthopedic Surgery

## 2021-01-11 DIAGNOSIS — G9589 Other specified diseases of spinal cord: Secondary | ICD-10-CM

## 2021-01-11 DIAGNOSIS — M76891 Other specified enthesopathies of right lower limb, excluding foot: Secondary | ICD-10-CM | POA: Diagnosis present

## 2021-01-12 ENCOUNTER — Other Ambulatory Visit: Payer: Self-pay | Admitting: Orthopedic Surgery

## 2021-01-12 ENCOUNTER — Ambulatory Visit
Admission: RE | Admit: 2021-01-12 | Discharge: 2021-01-12 | Disposition: A | Discharge status: Home / Self Care | Payer: Medicare Other | Source: Ambulatory Visit | Attending: Orthopedic Surgery | Admitting: Orthopedic Surgery

## 2021-01-12 DIAGNOSIS — G9589 Other specified diseases of spinal cord: Secondary | ICD-10-CM | POA: Insufficient documentation

## 2021-01-12 DIAGNOSIS — R222 Localized swelling, mass and lump, trunk: Secondary | ICD-10-CM

## 2021-01-12 MED ORDER — GADOBUTROL 1 MMOL/ML IV SOLN
7.0000 mL | Freq: Once | INTRAVENOUS | Status: AC | PRN
  Administered 2021-01-12: 7 mL via INTRAVENOUS

## 2021-01-13 ENCOUNTER — Other Ambulatory Visit: Payer: Commercial Managed Care - PPO

## 2021-01-14 ENCOUNTER — Other Ambulatory Visit: Payer: Self-pay | Admitting: Neurosurgery

## 2021-01-14 DIAGNOSIS — C7951 Secondary malignant neoplasm of bone: Secondary | ICD-10-CM

## 2021-01-14 DIAGNOSIS — M5416 Radiculopathy, lumbar region: Secondary | ICD-10-CM

## 2021-01-17 ENCOUNTER — Other Ambulatory Visit: Payer: Self-pay | Admitting: Neurosurgery

## 2021-01-17 DIAGNOSIS — R222 Localized swelling, mass and lump, trunk: Secondary | ICD-10-CM

## 2021-01-21 ENCOUNTER — Other Ambulatory Visit: Payer: Self-pay | Admitting: Radiology

## 2021-01-24 ENCOUNTER — Ambulatory Visit
Admission: RE | Admit: 2021-01-24 | Discharge: 2021-01-24 | Disposition: A | Discharge status: Home / Self Care | Payer: Medicare Other | Source: Ambulatory Visit | Attending: Neurosurgery | Admitting: Neurosurgery

## 2021-01-24 ENCOUNTER — Other Ambulatory Visit: Payer: Self-pay

## 2021-01-24 DIAGNOSIS — R222 Localized swelling, mass and lump, trunk: Secondary | ICD-10-CM | POA: Diagnosis present

## 2021-01-24 MED ORDER — FENTANYL CITRATE (PF) 100 MCG/2ML IJ SOLN
INTRAMUSCULAR | Status: AC
  Filled 2021-01-24: qty 2

## 2021-01-24 MED ORDER — MIDAZOLAM HCL 2 MG/2ML IJ SOLN
INTRAMUSCULAR | Status: AC
  Filled 2021-01-24: qty 2

## 2021-01-24 MED ORDER — SODIUM CHLORIDE 0.9 % IV SOLN: INTRAVENOUS | Status: DC

## 2021-01-24 MED ORDER — FENTANYL CITRATE (PF) 100 MCG/2ML IJ SOLN
INTRAMUSCULAR | Status: AC | PRN
  Administered 2021-01-24: 50 ug via INTRAVENOUS

## 2021-01-24 MED ORDER — MIDAZOLAM HCL 2 MG/2ML IJ SOLN
INTRAMUSCULAR | Status: AC | PRN
  Administered 2021-01-24: 1 mg via INTRAVENOUS

## 2021-01-24 NOTE — H&P (Signed)
Chief Complaint: Patient was seen in consultation today for right paraspinal mass at the request of [place]  Referring Physician(s): Meade Maw  Supervising Physician: Aletta Edouard  Patient Status: [facility] - Out-pt  History of Present Illness: Jordan Mason is a 75 y.o. male with recent findings of a right paraspinal mass causing lower back pain and right buttock pain rated today 5/10. The patient has had imaging and been seen by Neurosurgery on 12/2 and request received for IR image guided right paraspinal mass biopsy.   The patient has had a H&P performed within the last 30 days, all history, medications, and exam have been reviewed. The patient denies any interval changes since the H&P.  The patient denies any new or worsening chest pain, shortness of breath or palpitations. He denies any known bleeding or clotting disorder and denies any blood thinner use. The patient denies any history of sleep apnea or chronic oxygen use. He has no known complications to sedation.   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   a.)  s/p RIGHT sided PTA and stenting   MDD (major depressive disorder)    Myocardial infarction ([place]) 02/21/2006   Neuropathy    OSA (obstructive sleep apnea)    Peripheral vascular disease (HCC)    Postlaminectomy syndrome of lumbar region 09/21/2011   Postoperative atrial fibrillation (HCC)    Statin intolerance    Thoracic aortic aneurysm    s/p dissection and repair in 08/2012    Past Surgical History:  Procedure Laterality Date   ANGIOPLASTY / STENTING ILIAC Right 04/03/2006   Procedure(s): abdominal aortography,  bilateral lower extremity runoff, iliac PTA and stenting; Location: [facility]; Surgeon: Dionne Ano, MD   BLEPHAROPLASTY Bilateral 05/06/2019   Procedure: BLEPHAROPLASTY, UPPER EYELID; WITH EXCESSIVE SKIN WEIGHTING DOWN LID; Surgeon: Lucilla Lame, MD; Location: [place]; Service: Ophthalmology; Laterality: Bilateral   CARDIAC CATHETERIZATION N/A 03/15/2006   Location: [facility]; Surgeon: Samella Parr, MD   CARDIAC CATHETERIZATION N/A 03/23/2011   Location: [facility]; Surgeon: Kathe Mariner, MD   CATARACT EXTRACTION W/ INTRAOCULAR LENS IMPLANT Right 03/04/2019   Procedure: EXTRACAPSULAR CATARACT PHACO REMOVAL WITH INSERTION OF INTRAOCULAR LENS PROSTHESIS (1 STAGE PROCEDURE), MANUAL OR MECHANICAL TECHNIQUE; Surgeon: Christean Grief, MD; Location: [facility]; Service: Ophthalmology; Laterality: Right; incision approximated   CATARACT EXTRACTION W/ INTRAOCULAR LENS IMPLANT Left 02/18/2019   Procedure: DEXTENZA- EXTRACAPSULAR CATARACT ECCE REMOVAL WITH INSERTION OF INTRAOCULAR LENS PROSTHESIS (1 STAGE PROCEDURE), MANUAL OR MECHANICAL TECHNIQUE; Surgeon: Christean Grief, MD; Location: [facility]; Service: Ophthalmology; Laterality: Left   COLONOSCOPY     CORONARY ARTERY BYPASS GRAFT N/A 03/29/2011   4v; LIMA-LAD, SVG-RI, SVG-D1, SVG-RCA   ELECTROCONVULSIVE THERAPY N/A    Multiple treatments   FRACTURE SURGERY     LUMBAR LAMINECTOMY N/A 02/2004   L4-L5 and L5-S1   PERCUTANEOUS CORONARY STENT INTERVENTION (PCI-S) N/A 02/21/2006   Procedure: PCI with 3.5 x 16 mm Liberte BMS x 1 to RCA; Location: [facility]; Surgeon: Dionne Ano, MD   PERCUTANEOUS CORONARY STENT INTERVENTION (PCI-S) N/A 09/18/2006   Procedure: overlapping 3.5 x  18 mm Xience DES to in stent of BMS in RCA; Location: [facility]; Surgeon: Lovena Neighbours, MD   REPAIR OF ACUTE ASCENDING THORACIC AORTIC DISSECTION N/A 08/14/2012   Redo sternotomy for repair Type A dissection with ascending aortic graft, aortic valve resuspension, aortic valve central  plication of non-coronary cusp, STJ remodeling; Location: [facility]; Surgeon: Dr. Cheree Ditto   REVISION TOTAL KNEE ARTHROPLASTY Right 11/2009   TONSILLECTOMY     TOTAL HIP ARTHROPLASTY Right 08/24/2020   Procedure: TOTAL HIP ARTHROPLASTY ANTERIOR APPROACH;  Surgeon: Hessie Knows, MD;  Location: [facility];  Service: Orthopedics;  Laterality: Right;   TOTAL KNEE ARTHROPLASTY Right 02/2008    Allergies: Vortioxetine, Atorvastatin, Bupropion, Morphine, Pravastatin, Rosuvastatin, Felodipine, Hydrochlorothiazide w-triamterene, Lisinopril, and Metoprolol  Medications: Prior to Admission medications   Medication Sig Start Date End Date Taking? Authorizing Provider  amphetamine-dextroamphetamine (ADDERALL) 15 MG tablet Take 15 tablets by mouth 2 (two) times daily. 07/19/20  Yes [provider]  benazepril (LOTENSIN) 20 MG tablet Take 20 mg by mouth at bedtime. 06/26/20  Yes [provider]  celecoxib (CELEBREX) 200 MG capsule Take 200 mg by mouth 2 (two) times daily. 05/22/20  Yes [provider]  Cholecalciferol (VITAMIN D3 SUPER STRENGTH) 50 MCG (2000 UT) TABS Take 2,000 Units by mouth daily.   Yes [provider]  escitalopram (LEXAPRO) 20 MG tablet Take 20 mg by mouth at bedtime. 05/05/20  Yes [provider]  fluticasone (FLONASE) 50 MCG/ACT nasal spray Place 2 sprays into the nose daily.   Yes [provider]  HYDROcodone-acetaminophen (NORCO/VICODIN) 5-325 MG tablet Take 1-2 tablets by mouth every 4 (four) hours as needed for moderate pain (pain score 4-6). 08/25/20  Yes Mundy, Todd, PA-C  LATUDA 40 MG TABS tablet Take 40 mg by mouth at bedtime. 03/05/20  Yes [provider]  Multiple Vitamins-Minerals (MULTIVITAMIN WITH MINERALS) tablet Take 1 tablet by mouth daily.   Yes [provider]  pantoprazole (PROTONIX) 40 MG tablet Take 40 mg by mouth daily. 07/21/20  Yes [provider]  SYMBICORT 80-4.5 MCG/ACT inhaler Inhale 2 puffs into  the lungs 2 (two) times daily. 06/18/20  Yes [provider]  TIADYLT ER 360 MG 24 hr capsule Take 360 mg by mouth at bedtime. 06/26/20  Yes [provider]  VASCEPA 1 g capsule Take 2 g by mouth 2 (two) times daily. 03/16/20  Yes [provider]  AUSTEDO 9 MG TABS Take 9 mg by mouth 2 (two) times daily. Patient not taking: Reported on 08/12/2020 07/28/20   [provider]  enoxaparin (LOVENOX) 40 MG/0.4ML injection Inject 0.4 mLs (40 mg total) into the skin daily for 14 days. 08/25/20 09/08/20  Reche Dixon, PA-C  ibuprofen (ADVIL) 200 MG tablet Take 800 mg by mouth daily as needed.    [provider]  polyethylene glycol (MIRALAX / GLYCOLAX) 17 g packet Take 17 g by mouth daily.    [provider]  testosterone cypionate (DEPOTESTOSTERONE CYPIONATE) 200 MG/ML injection Inject 200 mg into the muscle every 14 (fourteen) days. 10/05/16   [provider]  traMADol (ULTRAM) 50 MG tablet Take 1 tablet (50 mg total) by mouth every 6 (six) hours. 08/25/20   Reche Dixon, PA-C     History reviewed. No pertinent family history.  Social History   Socioeconomic History   Marital status: Married    Spouse name: Not on file   Number of children: Not on file   Years of education: Not on file  Highest education level: Not on file  Occupational History   Not on file  Tobacco Use   Smoking status: Former    Types: Cigars    Quit date: 03/19/2011    Years since quitting: 9.8   Smokeless tobacco: Never  Vaping Use   Vaping Use: Never used  Substance and Sexual Activity   Alcohol use: Not Currently   Drug use: Never   Sexual activity: Not on file  Other Topics Concern   Not on file  Social History Narrative   Lives with wife    Social Determinants of Health   Financial Resource Strain: Not on file  Food Insecurity: Not on file  Transportation Needs: Not on file  Physical Activity: Not on file  Stress: Not on file  Social Connections: Not  on file   Review of Systems: A 12 point ROS discussed and pertinent positives are indicated in the HPI above.  All other systems are negative.  Review of Systems  Vital Signs: BP 132/74   Pulse 65   Temp 98.3 F (36.8 C) (Oral)   Resp 10   Ht 5\' 11"  (1.803 m)   Wt 170 lb (77.1 kg)   SpO2 98%   BMI 23.71 kg/m   Physical Exam Constitutional:      Appearance: Normal appearance.  HENT:     Head: Normocephalic and atraumatic.  Cardiovascular:     Rate and Rhythm: Normal rate and regular rhythm.  Pulmonary:     Effort: Pulmonary effort is normal. No respiratory distress.     Breath sounds: Normal breath sounds.  Abdominal:     General: Abdomen is flat.     Palpations: Abdomen is soft.  Skin:    General: Skin is warm and dry.  Neurological:     Mental Status: He is alert and oriented to person, place, and time.    Imaging: MR Lumbar Spine W Wo Contrast  Result Date: 01/12/2021 CLINICAL DATA:  Right hip pain for 3 months. Follow-up mass seen on prior right hip MRI. EXAM: MRI LUMBAR SPINE WITHOUT AND WITH CONTRAST TECHNIQUE: Multiplanar and multiecho pulse sequences of the lumbar spine were obtained without and with intravenous contrast. CONTRAST:  25mL GADAVIST GADOBUTROL 1 MMOL/ML IV SOLN COMPARISON:  Right hip MRI 01/11/2021. Prior lumbar spine MRI 09/23/2018 FINDINGS: Segmentation: There are five lumbar type vertebral bodies. The last full intervertebral disc space is labeled L5-S1. This correlates with the prior MRI examination Alignment: Stable degenerative lumbar spondylosis with multilevel degenerative listhesis. Vertebrae: Normal marrow signal. No fractures or bone lesions involving the lumbar spine. As demonstrated on the recent MRI of the right hip there is a complex mass involving the lower right paraspinal muscles and also involving the adjacent posterior elements of the sacrum. There is some enhancing tumor in the right S1 neural foramen displacing the nerve root  anteriorly. This is an indeterminate finding but appears aggressive and enhancing and was not present in 2020. I do not see any other definite bone lesions other than scattered hemangiomas. Recommend biopsy for tissue diagnosis. Chordoma, myxoma and sarcoma are possibilities. Conus medullaris and cauda equina: Conus extends to the T12 level. Conus and cauda equina appear normal. Paraspinal and other soft tissues: No significant paraspinal findings. No adenopathy or aneurysm. Disc levels: T11-12: Shallow left paracentral disc protrusion noted on the sagittal images but not covered on the axial images. T12-L1: No significant findings. L1-2: Advanced degenerative disc disease with a bulging degenerated annulus and osteophytic ridging. There is  mild bilateral lateral recess stenosis but no significant spinal or foraminal stenosis. This appears relatively stable. L2-3: Stable advanced degenerative disc disease and facet disease. There is a new left-sided disc osteophyte complex with moderate mass effect on the left side of the thecal sac likely affecting the left L3 nerve root in the lateral recess. There is also left foraminal encroachment but no obvious direct neural compression of L2. L3-4: Bulging degenerated annulus, osteophytic ridging, facet disease and ligamentum flavum thickening all contributing to moderate to moderately severe spinal and bilateral lateral recess stenosis. This appears stable. There is also the broad-based extraforaminal and far lateral disc osteophyte complex potentially irritating the extraforaminal L3 nerve root. This also appears stable. L4-5: Moderate facet disease but no disc protrusions, spinal or foraminal stenosis. L5-S1: Partially fused disc space.  No spinal or foraminal stenosis. IMPRESSION: 1. Complex 6.3 x 5.2 x 4.1 cm enhancing soft tissue mass involving the lower right paraspinal muscles and also involving the adjacent posterior elements of the sacrum. There is some enhancing  tumor in the right S1 neural foramen displacing the nerve root anteriorly. Recommend biopsy for tissue diagnosis. CT chest, abdomen and pelvis or PET-CT may be helpful for further evaluation. 2. Stable advanced degenerative lumbar spondylosis with multilevel disc disease and facet disease. 3. New left-sided disc osteophyte complex at L2-3 with moderate mass effect on the left side of the thecal sac likely affecting the left L3 nerve root in the lateral recess. 4. Stable moderate to moderately severe spinal and bilateral lateral recess stenosis at L3-4. There is also the broad-based extraforaminal and far lateral disc osteophyte complex potentially irritating the extraforaminal L3 nerve root. Electronically Signed   By: Marijo Sanes M.D.   On: 01/12/2021 11:39   MR HIP RIGHT WO CONTRAST  Result Date: 01/11/2021 CLINICAL DATA:  Right hip and groin pain for 3 months. The patient has right total hip arthroplasty. EXAM: MR OF THE RIGHT HIP WITHOUT CONTRAST TECHNIQUE: Multiplanar, multisequence MR imaging was performed. No intravenous contrast was administered. COMPARISON:  08/24/2020 FINDINGS: Bones: We partially include a mass or complex cystic lesion along the right posterior L5 vertebra and sacrum with involvement of the right lower paravertebral space, which appears to extend into and through the right first and second sacral foraminal, with associated bony erosions and likely impingement on the associated sacral nerve roots. The component extending through the S2 foramen also extends into the right piriformis muscle. Unfortunately this is only partially characterized on today's exam which was focused on the right hip. This lesion was not present on the 09/23/2018 MRI lumbar spine. There is also severe lumbar spondylosis and degenerative disc disease, including a potential left paracentral disc protrusion or fragment at the L2-3 level. Interbody fusion between L5 and S1. Metal artifact observed associated with  the right total hip prosthesis. No compelling findings of surrounding particulate disease in the bony structures. No substantial surrounding marrow edema on STIR images. The distal most margin of the stem of the femoral component of the implant is not included on today's exam. The SI joints appear unremarkable where included. Articular cartilage and labrum Articular cartilage:  N/A Labrum:  N/A Joint or bursal effusion Joint effusion:  None appreciable Bursae: No regional bursitis Muscles and tendons Muscles and tendons: Right paraspinal lesion discussed above involves the lower paraspinal musculature and also extends through the sacrum and into the right piriformis muscle. Other findings Miscellaneous:   Sigmoid colon diverticulosis. IMPRESSION: 1. Cystic mass along the right lower  paraspinal space partially eroding the right S1 lamina and extending through and potentially expanding the right S1 and S2 sacral foramina, with likely impingement on the associated nerve roots, and extension into the right piriformis muscle. This mass was not appreciable on the prior lumbar MRI from 09/23/2018. Possibilities may include complex ganglion cyst, unusual expansile synovial cyst, epidermoid cyst, or neoplastic process such as chordoma. This lesion is only partially characterized on today's examination. I recommend dedicated MRI of the lumbar spine with and without contrast along with dedicated MRI of the sacrum with and without contrast for more complete assessment. 2. Prominent lumbar spondylosis and degenerative disc disease can also be further assessed at lumbar MRI. 3. No compelling findings particulate disease, or abnormal fluid collection around the right hip implant. 4. Sigmoid colon diverticulosis. Electronically Signed   By: Van Clines M.D.   On: 01/11/2021 09:07    Labs:  CBC: Recent Labs    08/12/20 1142 08/24/20 1720 08/25/20 0421  WBC 14.2* 19.7* 11.8*  HGB 15.8 13.6 13.3  HCT 44.6 37.6*  37.9*  PLT 395 333 303    COAGS: No results for input(s): INR, APTT in the last 8760 hours.  BMP: Recent Labs    08/12/20 1142 08/24/20 1720 08/25/20 0421  NA 139  --  138  K 4.3  --  4.2  CL 103  --  110  CO2 27  --  25  GLUCOSE 107*  --  116*  BUN 17  --  18  CALCIUM 9.5  --  8.2*  CREATININE 1.51* 1.13 0.95  GFRNONAA 48* >60 >60    LIVER FUNCTION TESTS: Recent Labs    08/12/20 1142  BILITOT 0.9  AST 23  ALT 21  ALKPHOS 61  PROT 7.2  ALBUMIN 3.9    Assessment and Plan: 75 year old male with recent findings of a right paraspinal mass causing lower back pain and right buttock pain rated today 5/10. The patient has had imaging and been seen by Neurosurgery on 12/2 and request received for IR image guided right paraspinal mass biopsy. No medical changes since Neurosurgery visit on 12/2.   The patient has been NPO, no blood thinners taken, imaging, labs and vitals have been reviewed.  Risks and benefits of CT guided right paraspinal mass biopsy with moderate sedation was discussed with the patient and/or patient's family including, but not limited to bleeding, infection, damage to adjacent structures or low yield requiring additional tests.  All of the questions were answered and there is agreement to proceed.  Consent signed and in chart.   Thank you for this interesting consult.  I greatly enjoyed meeting CHIA ROCK and look forward to participating in their care.  A copy of this report was sent to the requesting provider on this date.  Electronically Signed: Hedy Jacob, PA-C 01/24/2021, 10:26 AM   I spent a total of 15 Minutes in face to face in clinical consultation, greater than 50% of which was counseling/coordinating care for right paraspinal mass.

## 2021-01-24 NOTE — Procedures (Signed)
Interventional Radiology Procedure Note  Procedure: CT Guided Biopsy of right sacral/paraspinous mass  Complications: None  Estimated Blood Loss: < 10 mL  Findings: 18 G core biopsy of sacral mass performed under CT guidance.  Four core samples obtained and sent to Pathology.  Venetia Night. Kathlene Cote, M.D Pager:  [phone]

## 2021-01-26 ENCOUNTER — Telehealth: Payer: Self-pay | Admitting: *Deleted

## 2021-01-26 NOTE — Telephone Encounter (Signed)
Contacted Mr. Beauchaine prior to his NP apt with Dr. Grayland Ormond. Oncology services introduced to pt via telephone. Reviewed location of cancer center and [facility] services with patient. Pt states that he will keep his NP apt tomorrow as scheduled. He thanked me for calling him to review direction on c.ctr location.

## 2021-01-27 ENCOUNTER — Ambulatory Visit: Payer: Medicare Other

## 2021-01-27 ENCOUNTER — Other Ambulatory Visit: Payer: Self-pay

## 2021-01-27 ENCOUNTER — Ambulatory Visit
Admission: RE | Admit: 2021-01-27 | Discharge: 2021-01-27 | Disposition: A | Discharge status: Home / Self Care | Payer: Medicare Other | Source: Ambulatory Visit | Attending: Neurosurgery | Admitting: Neurosurgery

## 2021-01-27 ENCOUNTER — Inpatient Hospital Stay: Payer: Medicare Other

## 2021-01-27 ENCOUNTER — Inpatient Hospital Stay: Payer: Medicare Other | Attending: Oncology | Admitting: Oncology

## 2021-01-27 VITALS — BP 132/81 | HR 79 | Temp 98.3°F | Resp 16 | Wt 164.9 lb

## 2021-01-27 DIAGNOSIS — I252 Old myocardial infarction: Secondary | ICD-10-CM

## 2021-01-27 DIAGNOSIS — C7951 Secondary malignant neoplasm of bone: Secondary | ICD-10-CM | POA: Diagnosis present

## 2021-01-27 DIAGNOSIS — M533 Sacrococcygeal disorders, not elsewhere classified: Secondary | ICD-10-CM

## 2021-01-27 DIAGNOSIS — C8510 Unspecified B-cell lymphoma, unspecified site: Secondary | ICD-10-CM | POA: Insufficient documentation

## 2021-01-27 DIAGNOSIS — M5416 Radiculopathy, lumbar region: Secondary | ICD-10-CM | POA: Diagnosis present

## 2021-01-27 MED ORDER — GADOBUTROL 1 MMOL/ML IV SOLN
7.5000 mL | Freq: Once | INTRAVENOUS | Status: AC | PRN
  Administered 2021-01-27: 7 mL via INTRAVENOUS

## 2021-01-27 NOTE — Progress Notes (Signed)
Pt c/o right hip pain. Pt wanting to know more about diagnosis and treatment plan.

## 2021-01-27 NOTE — Progress Notes (Signed)
[place]  [phone] [phone]) [phone]  ID: Ilda Mori OB: 04/21/1945  MR#: [id number]  PFX#:[id number]  Patient Care Team: Donnamarie Rossetti, PA-C as PCP - General (Family Medicine)  CHIEF COMPLAINT: Large B-cell lymphoma.  INTERVAL HISTORY: Patient is a 75 year old male who initially presented with back pain.  Subsequent work-up revealed a sacral mass which was biopsied confirming the above-stated malignancy.  He otherwise feels well and is asymptomatic.  He denies any night sweats, fevers, or weight loss.  He has no neurologic complaints.  He has no chest pain, shortness of breath, cough, or hemoptysis.  He denies any nausea, vomiting, constipation, or diarrhea.  He has no urinary complaints.  Patient offers no further specific complaints today.  REVIEW OF SYSTEMS:   Review of Systems  Constitutional: Negative.  Negative for fever, malaise/fatigue and weight loss.  Respiratory: Negative.  Negative for cough, hemoptysis and shortness of breath.   Cardiovascular: Negative.  Negative for chest pain and leg swelling.  Gastrointestinal: Negative.  Negative for abdominal pain.  Genitourinary: Negative.  Negative for dysuria.  Musculoskeletal:  Positive for back pain.  Skin: Negative.  Negative for rash.  Neurological: Negative.  Negative for dizziness, focal weakness, weakness and headaches.  Psychiatric/Behavioral: Negative.  The patient is not nervous/anxious.    As per HPI. Otherwise, a complete review of systems is negative.  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   a.)  s/p RIGHT sided PTA and  stenting   MDD (major depressive disorder)    Myocardial infarction ([place]) 02/21/2006   Neuropathy    OSA (obstructive sleep apnea)    Peripheral vascular disease (HCC)    Postlaminectomy syndrome of lumbar region 09/21/2011   Postoperative atrial fibrillation (HCC)    Statin intolerance    Thoracic aortic aneurysm    s/p dissection and repair in 08/2012    PAST SURGICAL HISTORY: Past Surgical History:  Procedure Laterality Date   ANGIOPLASTY / STENTING ILIAC Right 04/03/2006   Procedure(s): abdominal aortography, bilateral lower extremity runoff, iliac PTA and stenting; Location: [facility]; Surgeon: Dionne Ano, MD   BLEPHAROPLASTY Bilateral 05/06/2019   Procedure: BLEPHAROPLASTY, UPPER EYELID; WITH EXCESSIVE SKIN WEIGHTING DOWN LID; Surgeon: Lucilla Lame, MD; Location: [facility]; Service: Ophthalmology; Laterality: Bilateral   CARDIAC CATHETERIZATION N/A 03/15/2006   Location: [facility]; Surgeon: Samella Parr, MD   CARDIAC CATHETERIZATION N/A 03/23/2011   Location: [facility]; Surgeon: Kathe Mariner, MD   CATARACT EXTRACTION W/ INTRAOCULAR LENS IMPLANT Right 03/04/2019   Procedure: EXTRACAPSULAR CATARACT PHACO REMOVAL WITH INSERTION OF INTRAOCULAR LENS PROSTHESIS (1 STAGE PROCEDURE), MANUAL OR MECHANICAL TECHNIQUE; Surgeon: Christean Grief, MD; Location: [facility]; Service: Ophthalmology; Laterality: Right; incision approximated   CATARACT EXTRACTION W/ INTRAOCULAR LENS IMPLANT Left 02/18/2019   Procedure: DEXTENZA- EXTRACAPSULAR CATARACT ECCE REMOVAL WITH INSERTION OF INTRAOCULAR LENS PROSTHESIS (1 STAGE PROCEDURE), MANUAL OR MECHANICAL TECHNIQUE; Surgeon: Christean Grief, MD; Location: [facility]; Service: Ophthalmology; Laterality: Left   COLONOSCOPY     CORONARY ARTERY BYPASS GRAFT N/A 03/29/2011   4v; LIMA-LAD, SVG-RI,  SVG-D1, SVG-RCA   ELECTROCONVULSIVE THERAPY N/A    Multiple treatments   FRACTURE SURGERY     LUMBAR LAMINECTOMY N/A 02/2004   L4-L5 and L5-S1   PERCUTANEOUS  CORONARY STENT INTERVENTION (PCI-S) N/A 02/21/2006   Procedure: PCI with 3.5 x 16 mm Liberte BMS x 1 to RCA; Location: [facility]; Surgeon: Dionne Ano, MD   PERCUTANEOUS CORONARY STENT INTERVENTION (PCI-S) N/A 09/18/2006   Procedure: overlapping 3.5 x 18 mm Xience DES to in stent of BMS in RCA; Location: [facility]; Surgeon: Lovena Neighbours, MD   REPAIR OF ACUTE ASCENDING THORACIC AORTIC DISSECTION N/A 08/14/2012   Redo sternotomy for repair Type A dissection with ascending aortic graft, aortic valve resuspension, aortic valve central plication of non-coronary cusp, STJ remodeling; Location: [facility]; Surgeon: Dr. Cheree Ditto   REVISION TOTAL KNEE ARTHROPLASTY Right 11/2009   TONSILLECTOMY     TOTAL HIP ARTHROPLASTY Right 08/24/2020   Procedure: TOTAL HIP ARTHROPLASTY ANTERIOR APPROACH;  Surgeon: Hessie Knows, MD;  Location: [facility];  Service: Orthopedics;  Laterality: Right;   TOTAL KNEE ARTHROPLASTY Right 02/2008    FAMILY HISTORY: No family history on file.  ADVANCED DIRECTIVES (Y/N):  N  HEALTH MAINTENANCE: Social History   Tobacco Use   Smoking status: Former    Types: Cigars    Quit date: 03/19/2011    Years since quitting: 9.8   Smokeless tobacco: Never  Vaping Use   Vaping Use: Never used  Substance Use Topics   Alcohol use: Not Currently   Drug use: Never     Colonoscopy:  PAP:  Bone density:  Lipid panel:  Allergies  Allergen Reactions   Vortioxetine Other (See Comments)    Serotonin syndrome     Atorvastatin Other (See Comments)     Muscle Pain   Bupropion Other (See Comments)    vision problem     Morphine Nausea Only and Other (See Comments)    "dreams"    Pravastatin Other (See Comments)     Muscle Pain    Rosuvastatin Other (See Comments)    Muscle Pain   Felodipine Other (See Comments)     sexual dysfunction     Hydrochlorothiazide W-Triamterene Other (See Comments)    leg cramps     Lisinopril Other (See Comments)     jitteriness     Metoprolol Other  (See Comments)    sexual dys, abn tongue sens.      Current Outpatient Medications  Medication Sig Dispense Refill   amphetamine-dextroamphetamine (ADDERALL) 15 MG tablet Take 15 tablets by mouth 2 (two) times daily.     benazepril (LOTENSIN) 20 MG tablet Take 20 mg by mouth at bedtime.     celecoxib (CELEBREX) 200 MG capsule Take 1 capsule by mouth 2 (two) times daily.     cetirizine (ZYRTEC) 10 MG tablet Take 10 mg by mouth daily.     Cholecalciferol (VITAMIN D3 SUPER STRENGTH) 50 MCG (2000 UT) TABS Take 2,000 Units by mouth daily.     escitalopram (LEXAPRO) 20 MG tablet Take 20 mg by mouth at bedtime.     fluticasone (FLONASE) 50 MCG/ACT nasal spray Place 2 sprays into the nose daily.     HYDROcodone-acetaminophen (NORCO/VICODIN) 5-325 MG tablet Take 1-2 tablets by mouth every 4 (four) hours as needed for moderate pain (pain score 4-6). 30 tablet 0   ibuprofen (ADVIL) 200 MG tablet Take 800 mg by mouth daily as needed.     LATUDA 40 MG TABS tablet Take 40 mg by mouth at  bedtime.     Multiple Vitamins-Minerals (MULTIVITAMIN WITH MINERALS) tablet Take 1 tablet by mouth daily.     pantoprazole (PROTONIX) 40 MG tablet Take 40 mg by mouth daily.     polyethylene glycol (MIRALAX / GLYCOLAX) 17 g packet Take 17 g by mouth daily.     SYMBICORT 80-4.5 MCG/ACT inhaler Inhale 2 puffs into the lungs 2 (two) times daily.     testosterone cypionate (DEPOTESTOSTERONE CYPIONATE) 200 MG/ML injection Inject 200 mg into the muscle every 14 (fourteen) days.     TIADYLT ER 360 MG 24 hr capsule Take 360 mg by mouth at bedtime.     traMADol (ULTRAM) 50 MG tablet Take 1 tablet (50 mg total) by mouth every 6 (six) hours. 30 tablet 0   VASCEPA 1 g capsule Take 2 g by mouth 2 (two) times daily.     AUSTEDO 9 MG TABS Take 9 mg by mouth 2 (two) times daily. (Patient not taking: Reported on 08/12/2020)     enoxaparin (LOVENOX) 40 MG/0.4ML injection Inject 0.4 mLs (40 mg total) into the skin daily for 14 days. 5.6 mL  0   No current facility-administered medications for this visit.    OBJECTIVE: Vitals:   01/27/21 0842  BP: 132/81  Pulse: 79  Resp: 16  Temp: 98.3 F (36.8 C)  SpO2: 98%     Body mass index is 23 kg/m.    ECOG FS:0 - Asymptomatic  General: Well-developed, well-nourished, no acute distress. Eyes: Pink conjunctiva, anicteric sclera. HEENT: Normocephalic, moist mucous membranes. Lungs: No audible wheezing or coughing. Heart: Regular rate and rhythm. Abdomen: Soft, nontender, no obvious distention. Musculoskeletal: No edema, cyanosis, or clubbing. Neuro: Alert, answering all questions appropriately. Cranial nerves grossly intact. Skin: No rashes or petechiae noted. Psych: Normal affect. Lymphatics: No cervical, calvicular, axillary or inguinal LAD.   LAB RESULTS:  Lab Results  Component Value Date   NA 138 08/25/2020   K 4.2 08/25/2020   CL 110 08/25/2020   CO2 25 08/25/2020   GLUCOSE 116 (H) 08/25/2020   BUN 18 08/25/2020   CREATININE 0.95 08/25/2020   CALCIUM 8.2 (L) 08/25/2020   PROT 7.2 08/12/2020   ALBUMIN 3.9 08/12/2020   AST 23 08/12/2020   ALT 21 08/12/2020   ALKPHOS 61 08/12/2020   BILITOT 0.9 08/12/2020   GFRNONAA >60 08/25/2020    Lab Results  Component Value Date   WBC 11.8 (H) 08/25/2020   NEUTROABS 9.3 (H) 08/12/2020   HGB 13.3 08/25/2020   HCT 37.9 (L) 08/25/2020   MCV 93.6 08/25/2020   PLT 303 08/25/2020     STUDIES: MR Lumbar Spine W Wo Contrast  Result Date: 01/12/2021 CLINICAL DATA:  Right hip pain for 3 months. Follow-up mass seen on prior right hip MRI. EXAM: MRI LUMBAR SPINE WITHOUT AND WITH CONTRAST TECHNIQUE: Multiplanar and multiecho pulse sequences of the lumbar spine were obtained without and with intravenous contrast. CONTRAST:  69m GADAVIST GADOBUTROL 1 MMOL/ML IV SOLN COMPARISON:  Right hip MRI 01/11/2021. Prior lumbar spine MRI 09/23/2018 FINDINGS: Segmentation: There are five lumbar type vertebral bodies. The last full  intervertebral disc space is labeled L5-S1. This correlates with the prior MRI examination Alignment: Stable degenerative lumbar spondylosis with multilevel degenerative listhesis. Vertebrae: Normal marrow signal. No fractures or bone lesions involving the lumbar spine. As demonstrated on the recent MRI of the right hip there is a complex mass involving the lower right paraspinal muscles and also involving the adjacent posterior elements of the  sacrum. There is some enhancing tumor in the right S1 neural foramen displacing the nerve root anteriorly. This is an indeterminate finding but appears aggressive and enhancing and was not present in 2020. I do not see any other definite bone lesions other than scattered hemangiomas. Recommend biopsy for tissue diagnosis. Chordoma, myxoma and sarcoma are possibilities. Conus medullaris and cauda equina: Conus extends to the T12 level. Conus and cauda equina appear normal. Paraspinal and other soft tissues: No significant paraspinal findings. No adenopathy or aneurysm. Disc levels: T11-12: Shallow left paracentral disc protrusion noted on the sagittal images but not covered on the axial images. T12-L1: No significant findings. L1-2: Advanced degenerative disc disease with a bulging degenerated annulus and osteophytic ridging. There is mild bilateral lateral recess stenosis but no significant spinal or foraminal stenosis. This appears relatively stable. L2-3: Stable advanced degenerative disc disease and facet disease. There is a new left-sided disc osteophyte complex with moderate mass effect on the left side of the thecal sac likely affecting the left L3 nerve root in the lateral recess. There is also left foraminal encroachment but no obvious direct neural compression of L2. L3-4: Bulging degenerated annulus, osteophytic ridging, facet disease and ligamentum flavum thickening all contributing to moderate to moderately severe spinal and bilateral lateral recess stenosis. This  appears stable. There is also the broad-based extraforaminal and far lateral disc osteophyte complex potentially irritating the extraforaminal L3 nerve root. This also appears stable. L4-5: Moderate facet disease but no disc protrusions, spinal or foraminal stenosis. L5-S1: Partially fused disc space.  No spinal or foraminal stenosis. IMPRESSION: 1. Complex 6.3 x 5.2 x 4.1 cm enhancing soft tissue mass involving the lower right paraspinal muscles and also involving the adjacent posterior elements of the sacrum. There is some enhancing tumor in the right S1 neural foramen displacing the nerve root anteriorly. Recommend biopsy for tissue diagnosis. CT chest, abdomen and pelvis or PET-CT may be helpful for further evaluation. 2. Stable advanced degenerative lumbar spondylosis with multilevel disc disease and facet disease. 3. New left-sided disc osteophyte complex at L2-3 with moderate mass effect on the left side of the thecal sac likely affecting the left L3 nerve root in the lateral recess. 4. Stable moderate to moderately severe spinal and bilateral lateral recess stenosis at L3-4. There is also the broad-based extraforaminal and far lateral disc osteophyte complex potentially irritating the extraforaminal L3 nerve root. Electronically Signed   By: Marijo Sanes M.D.   On: 01/12/2021 11:39   MR HIP RIGHT WO CONTRAST  Result Date: 01/11/2021 CLINICAL DATA:  Right hip and groin pain for 3 months. The patient has right total hip arthroplasty. EXAM: MR OF THE RIGHT HIP WITHOUT CONTRAST TECHNIQUE: Multiplanar, multisequence MR imaging was performed. No intravenous contrast was administered. COMPARISON:  08/24/2020 FINDINGS: Bones: We partially include a mass or complex cystic lesion along the right posterior L5 vertebra and sacrum with involvement of the right lower paravertebral space, which appears to extend into and through the right first and second sacral foraminal, with associated bony erosions and likely  impingement on the associated sacral nerve roots. The component extending through the S2 foramen also extends into the right piriformis muscle. Unfortunately this is only partially characterized on today's exam which was focused on the right hip. This lesion was not present on the 09/23/2018 MRI lumbar spine. There is also severe lumbar spondylosis and degenerative disc disease, including a potential left paracentral disc protrusion or fragment at the L2-3 level. Interbody fusion between L5  and S1. Metal artifact observed associated with the right total hip prosthesis. No compelling findings of surrounding particulate disease in the bony structures. No substantial surrounding marrow edema on STIR images. The distal most margin of the stem of the femoral component of the implant is not included on today's exam. The SI joints appear unremarkable where included. Articular cartilage and labrum Articular cartilage:  N/A Labrum:  N/A Joint or bursal effusion Joint effusion:  None appreciable Bursae: No regional bursitis Muscles and tendons Muscles and tendons: Right paraspinal lesion discussed above involves the lower paraspinal musculature and also extends through the sacrum and into the right piriformis muscle. Other findings Miscellaneous:   Sigmoid colon diverticulosis. IMPRESSION: 1. Cystic mass along the right lower paraspinal space partially eroding the right S1 lamina and extending through and potentially expanding the right S1 and S2 sacral foramina, with likely impingement on the associated nerve roots, and extension into the right piriformis muscle. This mass was not appreciable on the prior lumbar MRI from 09/23/2018. Possibilities may include complex ganglion cyst, unusual expansile synovial cyst, epidermoid cyst, or neoplastic process such as chordoma. This lesion is only partially characterized on today's examination. I recommend dedicated MRI of the lumbar spine with and without contrast along with  dedicated MRI of the sacrum with and without contrast for more complete assessment. 2. Prominent lumbar spondylosis and degenerative disc disease can also be further assessed at lumbar MRI. 3. No compelling findings particulate disease, or abnormal fluid collection around the right hip implant. 4. Sigmoid colon diverticulosis. Electronically Signed   By: Van Clines M.D.   On: 01/11/2021 09:07   CT BIOPSY  Result Date: 01/24/2021 CLINICAL DATA:  Mass of the right posterior sacrum and paraspinous musculature. EXAM: CT GUIDED CORE BIOPSY OF RIGHT SACRAL/PARASPINOUS MASS ANESTHESIA/SEDATION: Moderate (conscious) sedation was employed during this procedure. A total of Versed 1.0 mg and Fentanyl 50 mcg was administered intravenously by radiology nursing. Moderate Sedation Time: 20 minutes. The patient's level of consciousness and vital signs were monitored continuously by radiology nursing throughout the procedure under my direct supervision. PROCEDURE: The procedure risks, benefits, and alternatives were explained to the patient. Questions regarding the procedure were encouraged and answered. The patient understands and consents to the procedure. A time-out was performed prior to initiating the procedure. CT was performed through the sacrum and bony pelvis in a prone position. The right posterior pelvic region was prepped with chlorhexidine in a sterile fashion, and a sterile drape was applied covering the operative field. A sterile gown and sterile gloves were used for the procedure. Local anesthesia was provided with 1% Lidocaine. Under CT guidance, a 17 gauge trocar needle was advanced into the right posterior paraspinous region at the level of the upper sacrum. After confirming needle tip position, 4 separate coaxial 18 gauge core biopsy samples were obtained and submitted in formalin. Additional CT was then performed after outer needle removal. COMPLICATIONS: None FINDINGS: By CT, the right posterior  paraspinous soft tissue mass that also involves the posterior sacrum and causes bony destruction measures roughly 5 cm in greatest transverse diameter. Solid tissue was obtained from the mass. IMPRESSION: CT-guided core biopsy performed of a right posterior paraspinous and sacral mass measuring roughly 5 cm in greatest transverse diameter. Electronically Signed   By: Aletta Edouard M.D.   On: 01/24/2021 12:41    ASSESSMENT: Large B-cell lymphoma.  PLAN:    1.  Large B-cell lymphoma: Lumbar spine MRI results from January 12, 2021 reviewed independently  with a 6.3 cm soft tissue mass involving the lower right paraspinous muscles.  There is also some enhancing tumor in the right S1 neuroforamen displacing the nerve root anteriorly.  Biopsy confirmed diagnosis.  Patient also has had thoracic and cervical spine MRI and results are pending at time of dictation.  To complete the staging work-up, patient will require a PET scan, bone marrow biopsy, and lumbar puncture.  He will likely require chemotherapy with R-CHOP, therefore we will also get port placement and cardiac echo.  It is unclear if he will need intrathecal chemotherapy.  Return to clinic in 2 to 3 weeks after all of his diagnostic studies have been completed to discuss his final results and staging as well as treatment planning. 2.  Back pain: Continue hydrocodone as prescribed.  I spent a total of 60 minutes reviewing chart data, face-to-face evaluation with the patient, counseling and coordination of care as detailed above.   Patient expressed understanding and was in agreement with this plan. He also understands that He can call clinic at any time with any questions, concerns, or complaints.    Cancer Staging  No matching staging information was found for the patient.  Lloyd Huger, MD   01/27/2021 4:41 PM

## 2021-01-31 ENCOUNTER — Ambulatory Visit: Payer: Medicare Other

## 2021-02-02 ENCOUNTER — Ambulatory Visit
Admission: RE | Admit: 2021-02-02 | Discharge: 2021-02-02 | Disposition: A | Discharge status: Home / Self Care | Payer: Medicare Other | Source: Ambulatory Visit | Attending: Neurosurgery | Admitting: Neurosurgery

## 2021-02-02 DIAGNOSIS — C7951 Secondary malignant neoplasm of bone: Secondary | ICD-10-CM

## 2021-02-02 LAB — POCT I-STAT CREATININE: Creatinine, Ser: 1.2 mg/dL (ref 0.61–1.24)

## 2021-02-02 MED ORDER — IOHEXOL 300 MG/ML  SOLN
100.0000 mL | Freq: Once | INTRAMUSCULAR | Status: AC | PRN
  Administered 2021-02-02: 10:00:00 100 mL via INTRAVENOUS

## 2021-02-04 ENCOUNTER — Other Ambulatory Visit: Payer: Self-pay | Admitting: Radiology

## 2021-02-04 ENCOUNTER — Ambulatory Visit
Admission: RE | Admit: 2021-02-04 | Discharge: 2021-02-04 | Disposition: A | Discharge status: Home / Self Care | Payer: Medicare Other | Source: Ambulatory Visit | Attending: Oncology | Admitting: Oncology

## 2021-02-04 ENCOUNTER — Other Ambulatory Visit: Payer: Self-pay

## 2021-02-04 DIAGNOSIS — M533 Sacrococcygeal disorders, not elsewhere classified: Secondary | ICD-10-CM | POA: Diagnosis present

## 2021-02-04 MED ORDER — LIDOCAINE HCL (PF) 1 % IJ SOLN
10.0000 mL | Freq: Once | INTRAMUSCULAR | Status: AC
  Administered 2021-02-04: 09:00:00 10 mL
  Filled 2021-02-04: qty 10

## 2021-02-08 ENCOUNTER — Other Ambulatory Visit: Payer: Self-pay | Admitting: Radiology

## 2021-02-08 ENCOUNTER — Encounter
Admission: RE | Admit: 2021-02-08 | Discharge: 2021-02-08 | Disposition: A | Discharge status: Home / Self Care | Payer: Medicare Other | Source: Ambulatory Visit | Attending: Oncology | Admitting: Oncology

## 2021-02-08 ENCOUNTER — Other Ambulatory Visit: Payer: Self-pay

## 2021-02-08 DIAGNOSIS — I2510 Atherosclerotic heart disease of native coronary artery without angina pectoris: Secondary | ICD-10-CM | POA: Diagnosis not present

## 2021-02-08 DIAGNOSIS — M533 Sacrococcygeal disorders, not elsewhere classified: Secondary | ICD-10-CM | POA: Insufficient documentation

## 2021-02-08 DIAGNOSIS — I7121 Aneurysm of the ascending aorta, without rupture: Secondary | ICD-10-CM | POA: Insufficient documentation

## 2021-02-08 DIAGNOSIS — K449 Diaphragmatic hernia without obstruction or gangrene: Secondary | ICD-10-CM | POA: Insufficient documentation

## 2021-02-08 DIAGNOSIS — K5730 Diverticulosis of large intestine without perforation or abscess without bleeding: Secondary | ICD-10-CM | POA: Insufficient documentation

## 2021-02-08 DIAGNOSIS — M47816 Spondylosis without myelopathy or radiculopathy, lumbar region: Secondary | ICD-10-CM | POA: Diagnosis not present

## 2021-02-08 DIAGNOSIS — Z951 Presence of aortocoronary bypass graft: Secondary | ICD-10-CM | POA: Insufficient documentation

## 2021-02-08 DIAGNOSIS — I700 Atherosclerosis of aorta: Secondary | ICD-10-CM | POA: Diagnosis not present

## 2021-02-08 DIAGNOSIS — C8510 Unspecified B-cell lymphoma, unspecified site: Secondary | ICD-10-CM | POA: Diagnosis not present

## 2021-02-08 LAB — CYTOLOGY - NON PAP

## 2021-02-08 LAB — GLUCOSE, CAPILLARY: Glucose-Capillary: 94 mg/dL (ref 70–99)

## 2021-02-08 MED ORDER — FLUDEOXYGLUCOSE F - 18 (FDG) INJECTION
8.5000 | Freq: Once | INTRAVENOUS | Status: AC | PRN
  Administered 2021-02-08: 14:00:00 9.32 via INTRAVENOUS

## 2021-02-09 ENCOUNTER — Ambulatory Visit: Payer: Medicare Other

## 2021-02-09 ENCOUNTER — Other Ambulatory Visit: Payer: Self-pay

## 2021-02-09 ENCOUNTER — Ambulatory Visit
Admission: RE | Admit: 2021-02-09 | Discharge: 2021-02-09 | Disposition: A | Discharge status: Home / Self Care | Payer: Medicare Other | Source: Ambulatory Visit | Attending: Oncology | Admitting: Oncology

## 2021-02-09 DIAGNOSIS — M533 Sacrococcygeal disorders, not elsewhere classified: Secondary | ICD-10-CM | POA: Insufficient documentation

## 2021-02-09 DIAGNOSIS — C8510 Unspecified B-cell lymphoma, unspecified site: Secondary | ICD-10-CM | POA: Insufficient documentation

## 2021-02-09 DIAGNOSIS — D72820 Lymphocytosis (symptomatic): Secondary | ICD-10-CM | POA: Diagnosis not present

## 2021-02-09 DIAGNOSIS — C8300 Small cell B-cell lymphoma, unspecified site: Secondary | ICD-10-CM | POA: Insufficient documentation

## 2021-02-09 DIAGNOSIS — C8330 Diffuse large B-cell lymphoma, unspecified site: Secondary | ICD-10-CM | POA: Diagnosis not present

## 2021-02-09 DIAGNOSIS — M545 Low back pain, unspecified: Secondary | ICD-10-CM | POA: Diagnosis not present

## 2021-02-09 DIAGNOSIS — D75839 Thrombocytosis, unspecified: Secondary | ICD-10-CM | POA: Insufficient documentation

## 2021-02-09 LAB — CBC WITH DIFFERENTIAL/PLATELET
Abs Immature Granulocytes: 0.09 10*3/uL — ABNORMAL HIGH (ref 0.00–0.07)
Basophils Absolute: 0.1 10*3/uL (ref 0.0–0.1)
Basophils Relative: 1 %
Eosinophils Absolute: 0.3 10*3/uL (ref 0.0–0.5)
Eosinophils Relative: 2 %
HCT: 42.5 % (ref 39.0–52.0)
Hemoglobin: 15.1 g/dL (ref 13.0–17.0)
Immature Granulocytes: 1 %
Lymphocytes Relative: 35 %
Lymphs Abs: 4.7 10*3/uL — ABNORMAL HIGH (ref 0.7–4.0)
MCH: 32.7 pg (ref 26.0–34.0)
MCHC: 35.5 g/dL (ref 30.0–36.0)
MCV: 92 fL (ref 80.0–100.0)
Monocytes Absolute: 1.1 10*3/uL — ABNORMAL HIGH (ref 0.1–1.0)
Monocytes Relative: 8 %
Neutro Abs: 7.2 10*3/uL (ref 1.7–7.7)
Neutrophils Relative %: 53 %
Platelets: 459 10*3/uL — ABNORMAL HIGH (ref 150–400)
RBC: 4.62 MIL/uL (ref 4.22–5.81)
RDW: 13.7 % (ref 11.5–15.5)
WBC: 13.5 10*3/uL — ABNORMAL HIGH (ref 4.0–10.5)
nRBC: 0 % (ref 0.0–0.2)

## 2021-02-09 MED ORDER — FENTANYL CITRATE (PF) 100 MCG/2ML IJ SOLN
INTRAMUSCULAR | Status: AC | PRN
  Administered 2021-02-09: 25 ug via INTRAVENOUS

## 2021-02-09 MED ORDER — MIDAZOLAM HCL 2 MG/2ML IJ SOLN
INTRAMUSCULAR | Status: AC | PRN
  Administered 2021-02-09: 1 mg via INTRAVENOUS

## 2021-02-09 MED ORDER — FLUMAZENIL 0.5 MG/5ML IV SOLN
INTRAVENOUS | Status: AC
  Filled 2021-02-09: qty 5

## 2021-02-09 MED ORDER — FENTANYL CITRATE (PF) 100 MCG/2ML IJ SOLN
INTRAMUSCULAR | Status: AC
  Filled 2021-02-09: qty 2

## 2021-02-09 MED ORDER — NALOXONE HCL 2 MG/2ML IJ SOSY
PREFILLED_SYRINGE | INTRAMUSCULAR | Status: AC
  Filled 2021-02-09: qty 2

## 2021-02-09 MED ORDER — MIDAZOLAM HCL 2 MG/2ML IJ SOLN
INTRAMUSCULAR | Status: AC
  Filled 2021-02-09: qty 2

## 2021-02-09 MED ORDER — SODIUM CHLORIDE 0.9 % IV SOLN: INTRAVENOUS | Status: DC

## 2021-02-09 MED ORDER — HEPARIN SOD (PORK) LOCK FLUSH 100 UNIT/ML IV SOLN
INTRAVENOUS | Status: AC
  Filled 2021-02-09: qty 5

## 2021-02-09 NOTE — Procedures (Signed)
Interventional Radiology Procedure Note  Date of Procedure: 02/09/2021  Procedure: BMBx  Findings:  1. CT guided BMBx right posterior ilium    Complications: No immediate complications noted.   Estimated Blood Loss: minimal  Follow-up and Recommendations: 1. Bedrest 1 hour    Albin Felling, MD  Vascular & Interventional Radiology  02/09/2021 9:15 AM

## 2021-02-09 NOTE — H&P (Signed)
Interventional Radiology - Pre-procedure H&P    Referring Provider: Lloyd Huger, MD  Planned Procedure: BMBx    History of Present Illness  Jordan Mason is a 75 y.o. 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 Had previous biopsy of posterior sacral mass earlier this month demonstrating B cell lymphoma. Now needs BMBx to complete workup.   Reports pain in lower back today.    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   a.)  s/p RIGHT sided PTA and stenting   MDD (major depressive disorder)    Myocardial infarction ([place]) 02/21/2006   Neuropathy    OSA (obstructive sleep apnea)    Peripheral vascular disease (HCC)    Postlaminectomy syndrome of lumbar region 09/21/2011   Postoperative atrial fibrillation (HCC)    Statin intolerance    Thoracic aortic aneurysm    s/p dissection and repair in 08/2012    Surgical History  Past Surgical History:  Procedure Laterality Date   ANGIOPLASTY / STENTING ILIAC Right 04/03/2006   Procedure(s): abdominal aortography, bilateral lower extremity runoff, iliac PTA and stenting; Location: [facility]; Surgeon: Dionne Ano, MD   BLEPHAROPLASTY Bilateral 05/06/2019   Procedure: BLEPHAROPLASTY, UPPER EYELID; WITH EXCESSIVE SKIN WEIGHTING DOWN LID; Surgeon: Lucilla Lame, MD; Location: [place]; Service: Ophthalmology; Laterality: Bilateral   CARDIAC CATHETERIZATION N/A 03/15/2006   Location: [facility]; Surgeon: Samella Parr, MD   CARDIAC CATHETERIZATION N/A 03/23/2011   Location: [facility]; Surgeon: Kathe Mariner, MD   CATARACT EXTRACTION W/  INTRAOCULAR LENS IMPLANT Right 03/04/2019   Procedure: EXTRACAPSULAR CATARACT PHACO REMOVAL WITH INSERTION OF INTRAOCULAR LENS PROSTHESIS (1 STAGE PROCEDURE), MANUAL OR MECHANICAL TECHNIQUE; Surgeon: Christean Grief, MD; Location: [facility]; Service: Ophthalmology; Laterality: Right; incision approximated   CATARACT EXTRACTION W/ INTRAOCULAR LENS IMPLANT Left 02/18/2019   Procedure: DEXTENZA- EXTRACAPSULAR CATARACT ECCE REMOVAL WITH INSERTION OF INTRAOCULAR LENS PROSTHESIS (1 STAGE PROCEDURE), MANUAL OR MECHANICAL TECHNIQUE; Surgeon: Christean Grief, MD; Location: [facility]; Service: Ophthalmology; Laterality: Left   COLONOSCOPY     CORONARY ARTERY BYPASS GRAFT N/A 03/29/2011   4v; LIMA-LAD, SVG-RI, SVG-D1, SVG-RCA   ELECTROCONVULSIVE THERAPY N/A    Multiple treatments   FRACTURE SURGERY     LUMBAR LAMINECTOMY N/A 02/2004   L4-L5 and L5-S1   PERCUTANEOUS CORONARY STENT INTERVENTION (PCI-S) N/A 02/21/2006   Procedure: PCI with 3.5 x 16 mm Liberte BMS x 1 to RCA; Location: [facility]; Surgeon: Dionne Ano, MD   PERCUTANEOUS CORONARY STENT INTERVENTION (PCI-S) N/A 09/18/2006   Procedure: overlapping 3.5 x 18 mm Xience DES to in stent of BMS in RCA; Location: [facility]; Surgeon: Lovena Neighbours, MD   REPAIR OF ACUTE ASCENDING THORACIC AORTIC DISSECTION N/A 08/14/2012   Redo sternotomy for repair Type A dissection with ascending aortic graft, aortic valve resuspension, aortic valve central plication of non-coronary cusp, STJ remodeling; Location: [facility]; Surgeon: Dr. Cheree Ditto   REVISION TOTAL KNEE ARTHROPLASTY Right 11/2009   TONSILLECTOMY     TOTAL HIP ARTHROPLASTY Right 08/24/2020   Procedure:  TOTAL HIP ARTHROPLASTY ANTERIOR APPROACH;  Surgeon: Hessie Knows, MD;  Location: [facility];  Service: Orthopedics;  Laterality: Right;   TOTAL KNEE ARTHROPLASTY Right 02/2008     Medications  I have reviewed the current medication list. Refer to chart for details. Current Outpatient Medications  Medication Instructions    amphetamine-dextroamphetamine (ADDERALL) 15 MG tablet 15 tablets, Oral, 2 times daily   Austedo 9 mg, Oral, 2 times daily   benazepril (LOTENSIN) 20 mg, Oral, Daily at bedtime   celecoxib (CELEBREX) 200 MG capsule 1 capsule, Oral, 2 times daily   cetirizine (ZYRTEC) 10 mg, Oral, Daily   enoxaparin (LOVENOX) 40 mg, Subcutaneous, Every 24 hours   escitalopram (LEXAPRO) 20 mg, Oral, Daily at bedtime   fluticasone (FLONASE) 50 MCG/ACT nasal spray 2 sprays, Nasal, Daily   HYDROcodone-acetaminophen (NORCO/VICODIN) 5-325 MG tablet 1-2 tablets, Oral, Every 4 hours PRN   ibuprofen (ADVIL) 800 mg, Oral, Daily PRN   Latuda 40 mg, Oral, Daily at bedtime   Multiple Vitamins-Minerals (MULTIVITAMIN WITH MINERALS) tablet 1 tablet, Oral, Daily   pantoprazole (PROTONIX) 40 mg, Oral, Daily   polyethylene glycol (MIRALAX / GLYCOLAX) 17 g, Oral, Daily   SYMBICORT 80-4.5 MCG/ACT inhaler 2 puffs, Inhalation, 2 times daily   testosterone cypionate (DEPOTESTOSTERONE CYPIONATE) 200 mg, Intramuscular, Every 14 days   Tiadylt ER 360 mg, Oral, Daily at bedtime   traMADol (ULTRAM) 50 mg, Oral, Every 6 hours   Vascepa 2 g, Oral, 2 times daily   Vitamin D3 Super Strength 2,000 Units, Oral, Daily     I have reviewed prior sedation medication usage: Yes    Allergies Allergies  Allergen Reactions   Vortioxetine Other (See Comments)    Serotonin syndrome     Atorvastatin Other (See Comments)     Muscle Pain   Bupropion Other (See Comments)    vision problem     Morphine Nausea Only and Other (See Comments)    "dreams"    Pravastatin Other (See Comments)     Muscle Pain    Rosuvastatin Other (See Comments)    Muscle Pain   Felodipine Other (See Comments)     sexual dysfunction     Hydrochlorothiazide W-Triamterene Other (See Comments)    leg cramps     Lisinopril Other (See Comments)     jitteriness     Metoprolol Other (See Comments)    sexual dys, abn tongue sens.     Does patient have  contrast allergy: No     Physical Exam Current Vitals Temp: 98.3 F (36.8 C) (Temp Source: Oral)       Resp: 13   BP: 139/79   SpO2: 97 %   Height: 5\' 9"  (175.3 cm)   Weight: 73.9 kg   Body mass index is 24.07 kg/m.  General: Alert and answers questions appropriately. No apparent distress. HEENT: Normocephalic, atraumatic. Conjunctivae normal without scleral icterus. Mallampati score: II (hard and soft palate, upper portion of tonsils anduvula visible) Cardiac: Regular rate. No dependent edema. Pulmonary: Normal work of breathing. On room air. Abdominal: Soft without distension. Extremities: Normally-formed, well perfused.    Pertinent Lab Results Labs: CBC:    BMP:   Coagulation:    CBC Trends: No results for input(s): WBC, HGB, HCT, PLT in the last 72 hours.   Creatinine Trend: No results for input(s): CREATININE in the last 72 hours.   Relevant and/or Recent Imaging: CT Feb 02 2021   Assessment & Plan Jordan Mason is a 75 y.o.  male with a history of B cell lymphoma seen today for CT guided BMBx.  Patient is appropriate candidate for BMBx. Risks and benefits discussed, including but not limited to procedure-specific risks of bleeding/infection, and patient is agreeable to proceed.    Procedure Checklist:  Consent obtained: Risks of the procedure as well as the alternatives and risks of each were explained to the patient and/or caregiver.  Consent for the procedure was obtained and is signed in the bedside chart Consent obtained from: The patient Patient is appropriate candidate for sedation Yes ASA Classification: ASA 3 - Patient with moderate systemic disease with functional limitations NPO status: 0000  Code status:   Code Status: Prior Pre-procedural prep necessary: n/a     Albin Felling, MD  Vascular and Interventional Radiology 02/09/2021 8:14 AM

## 2021-02-10 ENCOUNTER — Other Ambulatory Visit: Payer: Self-pay | Admitting: Emergency Medicine

## 2021-02-10 ENCOUNTER — Other Ambulatory Visit: Payer: Self-pay | Admitting: Oncology

## 2021-02-10 ENCOUNTER — Other Ambulatory Visit: Payer: Self-pay | Admitting: Radiology

## 2021-02-10 DIAGNOSIS — M533 Sacrococcygeal disorders, not elsewhere classified: Secondary | ICD-10-CM

## 2021-02-10 MED ORDER — OXYCODONE HCL 10 MG PO TABS: 10.0000 mg | ORAL_TABLET | Freq: Two times a day (BID) | ORAL | 0 refills | Status: DC | PRN

## 2021-02-11 ENCOUNTER — Encounter: Payer: Self-pay | Admitting: Radiology

## 2021-02-11 ENCOUNTER — Other Ambulatory Visit: Payer: Self-pay | Admitting: Emergency Medicine

## 2021-02-11 ENCOUNTER — Other Ambulatory Visit: Payer: Self-pay

## 2021-02-11 ENCOUNTER — Ambulatory Visit
Admission: RE | Admit: 2021-02-11 | Discharge: 2021-02-11 | Disposition: A | Discharge status: Home / Self Care | Payer: Medicare Other | Source: Ambulatory Visit | Attending: Oncology | Admitting: Oncology

## 2021-02-11 ENCOUNTER — Ambulatory Visit: Payer: Medicare Other

## 2021-02-11 DIAGNOSIS — C8330 Diffuse large B-cell lymphoma, unspecified site: Secondary | ICD-10-CM | POA: Insufficient documentation

## 2021-02-11 DIAGNOSIS — M533 Sacrococcygeal disorders, not elsewhere classified: Secondary | ICD-10-CM | POA: Diagnosis not present

## 2021-02-11 HISTORY — PX: IR IMAGING GUIDED PORT INSERTION: IMG5740

## 2021-02-11 MED ORDER — SODIUM CHLORIDE 0.9 % IV SOLN
INTRAVENOUS | Status: DC
  Filled 2021-02-11: qty 1000

## 2021-02-11 MED ORDER — MIDAZOLAM HCL 2 MG/2ML IJ SOLN
INTRAMUSCULAR | Status: DC | PRN
  Administered 2021-02-11: .5 mg via INTRAVENOUS
  Administered 2021-02-11: 1 mg via INTRAVENOUS
  Administered 2021-02-11: .5 mg via INTRAVENOUS

## 2021-02-11 MED ORDER — LIDOCAINE-EPINEPHRINE 1 %-1:100000 IJ SOLN
INTRAMUSCULAR | Status: AC
  Administered 2021-02-11: 12:00:00 9 mL
  Filled 2021-02-11: qty 1

## 2021-02-11 MED ORDER — MIDAZOLAM HCL 2 MG/2ML IJ SOLN
INTRAMUSCULAR | Status: AC
  Filled 2021-02-11: qty 2

## 2021-02-11 MED ORDER — FENTANYL CITRATE (PF) 100 MCG/2ML IJ SOLN
INTRAMUSCULAR | Status: AC
  Filled 2021-02-11: qty 2

## 2021-02-11 MED ORDER — FENTANYL CITRATE (PF) 100 MCG/2ML IJ SOLN
INTRAMUSCULAR | Status: DC | PRN
  Administered 2021-02-11: 25 ug via INTRAVENOUS
  Administered 2021-02-11: 50 ug via INTRAVENOUS
  Administered 2021-02-11: 25 ug via INTRAVENOUS

## 2021-02-11 MED ORDER — HEPARIN SOD (PORK) LOCK FLUSH 100 UNIT/ML IV SOLN
INTRAVENOUS | Status: AC
  Administered 2021-02-11: 12:00:00 500 [IU]
  Filled 2021-02-11: qty 5

## 2021-02-11 NOTE — Procedures (Signed)
Interventional Radiology Procedure Note  Date of Procedure: 02/11/2021  Procedure: Chest port placement   Findings:  1. Placement of right chest port via right IJ    Complications: No immediate complications noted.   Estimated Blood Loss: minimal  Follow-up and Recommendations: 1. Ready for use    Albin Felling, MD  Vascular & Interventional Radiology  02/11/2021 11:54 AM

## 2021-02-11 NOTE — H&P (Signed)
Referring Physician(s): Finnegan,Timothy J  Supervising Physician: Juliet Rude  Patient Status:  [facility]  Chief Complaint:  "Im getting a port a cath"  Subjective: Patient familiar to IR service from right sacral/paraspinal mass biopsy on 01/24/2021 and bone marrow biopsy on 02/09/2021.  He has a history of newly diagnosed large B-cell lymphoma and presents again today for Port-A-Cath placement to assist with treatment.  He currently denies fever, headache, chest pain, dyspnea, cough, worsening abdominal/back pain, nausea, vomiting or bleeding.  Additional medical history as below.  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   a.)  s/p RIGHT sided PTA and stenting   MDD (major depressive disorder)    Myocardial infarction ([place]) 02/21/2006   Neuropathy    OSA (obstructive sleep apnea)    Peripheral vascular disease (HCC)    Postlaminectomy syndrome of lumbar region 09/21/2011   Postoperative atrial fibrillation (HCC)    Statin intolerance    Thoracic aortic aneurysm    s/p dissection and repair in 08/2012   Past Surgical History:  Procedure Laterality Date   ANGIOPLASTY / STENTING ILIAC Right 04/03/2006   Procedure(s): abdominal aortography, bilateral lower extremity runoff, iliac PTA and stenting; Location: [facility]; Surgeon: Dionne Ano, MD   BLEPHAROPLASTY Bilateral 05/06/2019   Procedure: BLEPHAROPLASTY, UPPER EYELID; WITH EXCESSIVE SKIN WEIGHTING DOWN LID; Surgeon: Lucilla Lame, MD; Location: [place]; Service: Ophthalmology; Laterality: Bilateral   CARDIAC CATHETERIZATION N/A 03/15/2006   Location: [facility]; Surgeon: Samella Parr, MD    CARDIAC CATHETERIZATION N/A 03/23/2011   Location: [facility]; Surgeon: Kathe Mariner, MD   CATARACT EXTRACTION W/ INTRAOCULAR LENS IMPLANT Right 03/04/2019   Procedure: EXTRACAPSULAR CATARACT PHACO REMOVAL WITH INSERTION OF INTRAOCULAR LENS PROSTHESIS (1 STAGE PROCEDURE), MANUAL OR MECHANICAL TECHNIQUE; Surgeon: Christean Grief, MD; Location: [facility]; Service: Ophthalmology; Laterality: Right; incision approximated   CATARACT EXTRACTION W/ INTRAOCULAR LENS IMPLANT Left 02/18/2019   Procedure: DEXTENZA- EXTRACAPSULAR CATARACT ECCE REMOVAL WITH INSERTION OF INTRAOCULAR LENS PROSTHESIS (1 STAGE PROCEDURE), MANUAL OR MECHANICAL TECHNIQUE; Surgeon: Christean Grief, MD; Location: [facility]; Service: Ophthalmology; Laterality: Left   COLONOSCOPY     CORONARY ARTERY BYPASS GRAFT N/A 03/29/2011   4v; LIMA-LAD, SVG-RI, SVG-D1, SVG-RCA   ELECTROCONVULSIVE THERAPY N/A    Multiple treatments   FRACTURE SURGERY     LUMBAR LAMINECTOMY N/A 02/2004   L4-L5 and L5-S1   PERCUTANEOUS CORONARY STENT INTERVENTION (PCI-S) N/A 02/21/2006   Procedure: PCI with 3.5 x 16 mm Liberte BMS x 1 to RCA; Location: [facility]; Surgeon: Dionne Ano, MD   PERCUTANEOUS CORONARY STENT INTERVENTION (PCI-S) N/A 09/18/2006   Procedure: overlapping 3.5 x 18 mm Xience DES to in stent of BMS in RCA; Location: [facility]; Surgeon: Lovena Neighbours, MD   REPAIR OF ACUTE ASCENDING THORACIC AORTIC DISSECTION N/A 08/14/2012   Redo sternotomy for repair Type A dissection with ascending aortic graft, aortic valve resuspension, aortic valve central plication of non-coronary cusp, STJ remodeling; Location: [facility]; Surgeon: Dr. Cheree Ditto   REVISION TOTAL KNEE ARTHROPLASTY Right 11/2009   TONSILLECTOMY     TOTAL HIP ARTHROPLASTY  Right 08/24/2020   Procedure: TOTAL HIP ARTHROPLASTY ANTERIOR APPROACH;  Surgeon: Hessie Knows, MD;  Location: [facility];  Service: Orthopedics;  Laterality: Right;   TOTAL KNEE ARTHROPLASTY Right 02/2008      Allergies: Vortioxetine,  Atorvastatin, Bupropion, Morphine, Pravastatin, Rosuvastatin, Felodipine, Hydrochlorothiazide w-triamterene, Lisinopril, and Metoprolol  Medications: Prior to Admission medications   Medication Sig Start Date End Date Taking? Authorizing Provider  amphetamine-dextroamphetamine (ADDERALL) 15 MG tablet Take 15 tablets by mouth 2 (two) times daily. 07/19/20  Yes [provider]  benazepril (LOTENSIN) 20 MG tablet Take 20 mg by mouth at bedtime. 06/26/20  Yes [provider]  celecoxib (CELEBREX) 200 MG capsule Take 1 capsule by mouth 2 (two) times daily. 12/03/20  Yes [provider]  cetirizine (ZYRTEC) 10 MG tablet Take 10 mg by mouth daily. 12/28/20  Yes [provider]  Cholecalciferol (VITAMIN D3 SUPER STRENGTH) 50 MCG (2000 UT) TABS Take 2,000 Units by mouth daily.   Yes [provider]  escitalopram (LEXAPRO) 20 MG tablet Take 20 mg by mouth at bedtime. 05/05/20  Yes [provider]  fluticasone (FLONASE) 50 MCG/ACT nasal spray Place 2 sprays into the nose daily.   Yes [provider]  HYDROcodone-acetaminophen (NORCO/VICODIN) 5-325 MG tablet Take 1-2 tablets by mouth every 4 (four) hours as needed for moderate pain (pain score 4-6). 08/25/20  Yes Reche Dixon, PA-C  ibuprofen (ADVIL) 200 MG tablet Take 800 mg by mouth daily as needed.   Yes [provider]  LATUDA 40 MG TABS tablet Take 40 mg by mouth at bedtime. 03/05/20  Yes [provider]  Multiple Vitamins-Minerals (MULTIVITAMIN WITH MINERALS) tablet Take 1 tablet by mouth daily.   Yes [provider]  pantoprazole (PROTONIX) 40 MG tablet Take 40 mg by mouth daily. 07/21/20  Yes [provider]  polyethylene glycol (MIRALAX / GLYCOLAX) 17 g packet Take 17 g by mouth daily.   Yes [provider]  SYMBICORT 80-4.5 MCG/ACT inhaler Inhale 2 puffs into the lungs 2 (two) times daily. 06/18/20  Yes [provider]  TIADYLT ER 360 MG 24 hr  capsule Take 360 mg by mouth at bedtime. 06/26/20  Yes [provider]  VASCEPA 1 g capsule Take 2 g by mouth 2 (two) times daily. 03/16/20  Yes [provider]  AUSTEDO 9 MG TABS Take 9 mg by mouth 2 (two) times daily. 07/28/20   [provider]  enoxaparin (LOVENOX) 40 MG/0.4ML injection Inject 0.4 mLs (40 mg total) into the skin daily for 14 days. 08/25/20 02/04/21  Reche Dixon, PA-C  Oxycodone HCl 10 MG TABS Take 1 tablet (10 mg total) by mouth 2 (two) times daily as needed. 02/10/21   Lloyd Huger, MD  testosterone cypionate (DEPOTESTOSTERONE CYPIONATE) 200 MG/ML injection Inject 200 mg into the muscle every 14 (fourteen) days. 10/05/16   [provider]  traMADol (ULTRAM) 50 MG tablet Take 1 tablet (50 mg total) by mouth every 6 (six) hours. Patient not taking: Reported on 02/09/2021 08/25/20   Reche Dixon, PA-C     Vital Signs: BP (!) 173/74    Pulse 72    Temp 98.1 F (36.7 C) (Oral)    Ht '5\' 9"'  (1.753 m)    Wt 163 lb (73.9 kg)    SpO2 99%    BMI 24.07 kg/m   Physical Exam awake, alert.  Chest clear to auscultation bilaterally.  Heart with regular rate and rhythm.  Abdomen soft, positive bowel sounds, nontender.  No significant lower extremity edema.  Imaging: NM PET Image Initial (PI) Skull Base To Thigh  Result Date: 02/09/2021 CLINICAL DATA:  Initial treatment strategy for large B-cell lymphoma. EXAM: NUCLEAR MEDICINE PET SKULL BASE TO THIGH TECHNIQUE: 9.3 mCi F-18 FDG was injected intravenously. Full-ring PET imaging was performed from the skull base to thigh after the radiotracer. CT data was obtained and used for attenuation correction and anatomic localization. Fasting blood glucose: 94 mg/dl COMPARISON:  Multiple exams, including CT scan 02/03/2020 FINDINGS: Mediastinal blood pool activity: SUV max 2.3 (taken from the abdominal aorta) Liver activity: SUV max 3.4 Unfortunately due to discomfort the patient terminated the PET portion of today's  examination with acquisition of only the proximal thighs through the kidneys, but not including vertical levels above the kidneys such as the upper portion of the upper abdomen, chest, and neck. Accordingly we have CT data but no PET data for those regions. The image region does include the only area of known definite tumor based on prior imaging, which is in the right hemipelvis region. NECK: No PET data available Incidental CT findings: Bilateral common carotid atherosclerotic calcifications. CHEST: No PET data available Incidental CT findings: Coronary, aortic arch, and branch vessel atherosclerotic vascular disease. Prior CABG. 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 Moderate-sized hiatal hernia. Right axillary clips noted. No pathologic adenopathy identified. Mild biapical pleuroparenchymal scarring. Mild scarring in the posterior basal segment left lower lobe. ABDOMEN/PELVIS: There is no PET data about the level of the kidneys, for example involving the spleen, and upper half of the liver. Right pelvic mass eroding the posterior margin of the right sacrum and medial margin of the right iliac bone noted in the right posterior paraspinal musculature, extending along the right S1 and S2 foramina and penetrating specially through the S2 foramen into the right presacral space, with some local mass effect on the adjacent sciatic nerve. The upper posterior paraspinal segment of this continuous mass has a maximum SUV of 20.2 (Deauville 5), and the presacral portion extending through the right sciatic notch along the piriformis muscle has maximum SUV of 22.4 (Deauville 5). Morphologically the mass appears similar to on recent CT examination of 02/02/2021, although the mass does not substantially cross midline and accordingly the measurement for the posterior paraspinal component is a little exaggerated on that exam. Currently the hypermetabolic activity associated with the posterior paraspinal component  measures about 4.7 by 4.9 by 8.1 cm, and the oval-shaped component tracking along the piriformis muscle measures about 8.0 by 4.2 by 3.4 cm. No separate hypermetabolic lesion is identified. Incidental CT findings: Atherosclerosis is present, including aortoiliac atherosclerotic disease. Sigmoid colon diverticulosis. Borderline prostatomegaly. SKELETON: Bony erosions of the right posterior sacrum and medial right iliac bone due to the posterior paraspinal component of the pelvic mass identified. No other hypermetabolic activity is identified from the L1-2 level down. Level cephalad to the L1-2 level do not have associated PET data. Incidental CT findings: Degenerative glenohumeral arthropathy bilaterally. Lumbar spondylosis and degenerative disc disease. IMPRESSION: 1. Unfortunately due to discomfort the patient terminated the PET portion of today's examination with acquisition of only the proximal thighs through the kidneys, but not including vertical levels above the kidneys such as the upper portion of the upper abdomen, the chest, and the neck. There is no known involvement in the region omitted on the PET data based on prior imaging. 2. The known mass of the right posterior paraspinal musculature along the upper sacrum tracking through the S1 and  S2 neural foramina into the right piriformis muscle and subsequently out through the sciatic notch is Deauville 5 with measurements as noted above. This erodes the posterior sacrum and part of the adjacent iliac bone. 3. 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 Recommend annual imaging followup by CTA or MRA. This recommendation follows 2010 ACCF/AHA/AATS/ACR/ASA/SCA/SCAI/SIR/STS/SVM Guidelines for the Diagnosis and Management of Patients with Thoracic Aortic Disease. Circulation. 2010; 121: E703-J009. Aortic aneurysm NOS (ICD10-I71.9) 4. Other imaging findings of potential clinical significance: Aortic Atherosclerosis (ICD10-I70.0). Coronary  atherosclerosis with prior CABG. Moderate-sized hiatal hernia. Sigmoid colon diverticulosis. Borderline prostatomegaly. Lumbar spondylosis and degenerative disc disease. Electronically Signed   By: Van Clines M.D.   On: 02/09/2021 12:03   CT BONE MARROW BIOPSY  Result Date: 02/09/2021 INDICATION: B-cell lymphoma EXAM: CT BIOPSY BONE MARROW MEDICATIONS: None. ANESTHESIA/SEDATION: Moderate (conscious) sedation was employed during this procedure. A total of Versed 1 mg and Fentanyl 25 mcg was administered intravenously. Moderate Sedation Time: 15 minutes. The patient's level of consciousness and vital signs were monitored continuously by radiology nursing throughout the procedure under my direct supervision. FLUOROSCOPY TIME:  N/a COMPLICATIONS: None immediate. PROCEDURE: Informed written consent was obtained from the patient after a thorough discussion of the procedural risks, benefits and alternatives. All questions were addressed. Maximal Sterile Barrier Technique was utilized including caps, mask, sterile gowns, sterile gloves, sterile drape, hand hygiene and skin antiseptic. A timeout was performed prior to the initiation of the procedure. The patient was placed prone on the CT exam table. Limited CT of the pelvis was performed for planning purposes. Skin entry site was marked, and the overlying skin was prepped and draped in the standard sterile fashion. Local analgesia was obtained with 1% lidocaine. Using CT guidance, an 11 gauge needle was advanced just deep to the cortex of the right posterior ilium. Subsequently, bone marrow aspiration and core biopsy were performed. Specimens were submitted to lab/pathology for handling. Hemostasis was achieved with manual pressure, and a clean dressing was placed. The patient tolerated the procedure well without immediate complication. IMPRESSION: Successful CT-guided bone marrow aspiration and core biopsy of the right posterior ilium. Electronically Signed    By: Albin Felling M.D.   On: 02/09/2021 10:33    Labs:  CBC: Recent Labs    08/12/20 1142 08/24/20 1720 08/25/20 0421 02/09/21 0754  WBC 14.2* 19.7* 11.8* 13.5*  HGB 15.8 13.6 13.3 15.1  HCT 44.6 37.6* 37.9* 42.5  PLT 395 333 303 459*    COAGS: No results for input(s): INR, APTT in the last 8760 hours.  BMP: Recent Labs    08/12/20 1142 08/24/20 1720 08/25/20 0421 02/02/21 0918  NA 139  --  138  --   K 4.3  --  4.2  --   CL 103  --  110  --   CO2 27  --  25  --   GLUCOSE 107*  --  116*  --   BUN 17  --  18  --   CALCIUM 9.5  --  8.2*  --   CREATININE 1.51* 1.13 0.95 1.20  GFRNONAA 48* >60 >60  --     LIVER FUNCTION TESTS: Recent Labs    08/12/20 1142  BILITOT 0.9  AST 23  ALT 21  ALKPHOS 61  PROT 7.2  ALBUMIN 3.9    Assessment and Plan: Patient familiar to IR service from right sacral/paraspinal mass biopsy on 01/24/2021 and bone marrow biopsy on 02/09/2021.  He has a history of  newly diagnosed large B-cell lymphoma and presents again today for Port-A-Cath placement to assist with treatment. Risks and benefits of image guided port-a-catheter placement was discussed with the patient/spouse including, but not limited to bleeding, infection, pneumothorax, or fibrin sheath development and need for additional procedures.  All of the patient's questions were answered, patient is agreeable to proceed. Consent signed and in chart.    Electronically Signed: D. Rowe Robert, PA-C 02/11/2021, 9:32 AM   I spent a total of 25 Minutes at the the patient's bedside AND on the patient's hospital floor or unit, greater than 50% of which was counseling/coordinating care for Port-A-Cath placement

## 2021-02-11 NOTE — Progress Notes (Signed)
Patient clinically stable post Port placement per DR El-Abd, tolerated well. Awake during procedure despite meds for sedation. Given Versed 2 mg along with Fentanyl 100 mcg IV for procedure.

## 2021-02-14 ENCOUNTER — Other Ambulatory Visit: Payer: Self-pay | Admitting: Oncology

## 2021-02-14 MED ORDER — HYDROCODONE-ACETAMINOPHEN 5-325 MG PO TABS: 1.0000 | ORAL_TABLET | Freq: Four times a day (QID) | ORAL | 0 refills | Status: DC | PRN

## 2021-02-15 ENCOUNTER — Other Ambulatory Visit: Payer: Self-pay | Admitting: Emergency Medicine

## 2021-02-15 ENCOUNTER — Ambulatory Visit
Admission: RE | Admit: 2021-02-15 | Discharge: 2021-02-15 | Disposition: A | Discharge status: Home / Self Care | Payer: Medicare Other | Source: Ambulatory Visit | Attending: Oncology | Admitting: Oncology

## 2021-02-15 ENCOUNTER — Encounter: Payer: Self-pay | Admitting: Neurosurgery

## 2021-02-15 DIAGNOSIS — Z0189 Encounter for other specified special examinations: Secondary | ICD-10-CM | POA: Diagnosis not present

## 2021-02-15 DIAGNOSIS — I252 Old myocardial infarction: Secondary | ICD-10-CM | POA: Insufficient documentation

## 2021-02-15 DIAGNOSIS — E785 Hyperlipidemia, unspecified: Secondary | ICD-10-CM | POA: Diagnosis not present

## 2021-02-15 DIAGNOSIS — Z951 Presence of aortocoronary bypass graft: Secondary | ICD-10-CM | POA: Insufficient documentation

## 2021-02-15 DIAGNOSIS — I2510 Atherosclerotic heart disease of native coronary artery without angina pectoris: Secondary | ICD-10-CM | POA: Insufficient documentation

## 2021-02-15 DIAGNOSIS — Z01818 Encounter for other preprocedural examination: Secondary | ICD-10-CM | POA: Diagnosis not present

## 2021-02-15 DIAGNOSIS — I351 Nonrheumatic aortic (valve) insufficiency: Secondary | ICD-10-CM | POA: Insufficient documentation

## 2021-02-15 DIAGNOSIS — I10 Essential (primary) hypertension: Secondary | ICD-10-CM | POA: Insufficient documentation

## 2021-02-15 DIAGNOSIS — C8510 Unspecified B-cell lymphoma, unspecified site: Secondary | ICD-10-CM

## 2021-02-15 LAB — SURGICAL PATHOLOGY

## 2021-02-15 LAB — ECHOCARDIOGRAM COMPLETE
AR max vel: 3.75 cm2
AV Area VTI: 4.86 cm2
AV Area mean vel: 4.06 cm2
AV Mean grad: 2 mmHg
AV Peak grad: 3.6 mmHg
Ao pk vel: 0.95 m/s
Area-P 1/2: 2.43 cm2
MV VTI: 3.98 cm2
S' Lateral: 2.2 cm

## 2021-02-15 NOTE — Progress Notes (Signed)
*  PRELIMINARY RESULTS* Echocardiogram 2D Echocardiogram has been performed.  Jordan Mason 02/15/2021, 9:02 AM

## 2021-02-16 ENCOUNTER — Encounter
Admission: RE | Admit: 2021-02-16 | Discharge: 2021-02-16 | Disposition: A | Discharge status: Home / Self Care | Payer: Medicare Other | Source: Ambulatory Visit | Attending: Oncology | Admitting: Oncology

## 2021-02-16 ENCOUNTER — Encounter (HOSPITAL_COMMUNITY): Payer: Self-pay | Admitting: Oncology

## 2021-02-16 DIAGNOSIS — M533 Sacrococcygeal disorders, not elsewhere classified: Secondary | ICD-10-CM | POA: Insufficient documentation

## 2021-02-16 LAB — GLUCOSE, CAPILLARY: Glucose-Capillary: 139 mg/dL — ABNORMAL HIGH (ref 70–99)

## 2021-02-16 MED ORDER — FLUDEOXYGLUCOSE F - 18 (FDG) INJECTION
8.4000 | Freq: Once | INTRAVENOUS | Status: AC | PRN
  Administered 2021-02-16: 9.14 via INTRAVENOUS

## 2021-02-17 LAB — SURGICAL PATHOLOGY

## 2021-02-19 ENCOUNTER — Observation Stay
Admission: EM | Admit: 2021-02-19 | Discharge: 2021-02-20 | Disposition: A | Discharge status: Home / Self Care | Payer: Medicare Other | Attending: Family Medicine | Admitting: Family Medicine

## 2021-02-19 ENCOUNTER — Encounter: Payer: Self-pay | Admitting: Pharmacy Technician

## 2021-02-19 ENCOUNTER — Other Ambulatory Visit: Payer: Self-pay

## 2021-02-19 ENCOUNTER — Emergency Department: Payer: Medicare Other

## 2021-02-19 DIAGNOSIS — C8330 Diffuse large B-cell lymphoma, unspecified site: Secondary | ICD-10-CM | POA: Diagnosis not present

## 2021-02-19 DIAGNOSIS — I2510 Atherosclerotic heart disease of native coronary artery without angina pectoris: Secondary | ICD-10-CM

## 2021-02-19 DIAGNOSIS — R5383 Other fatigue: Secondary | ICD-10-CM | POA: Diagnosis present

## 2021-02-19 DIAGNOSIS — R109 Unspecified abdominal pain: Secondary | ICD-10-CM | POA: Insufficient documentation

## 2021-02-19 DIAGNOSIS — D72829 Elevated white blood cell count, unspecified: Secondary | ICD-10-CM | POA: Insufficient documentation

## 2021-02-19 DIAGNOSIS — I739 Peripheral vascular disease, unspecified: Secondary | ICD-10-CM

## 2021-02-19 DIAGNOSIS — G893 Neoplasm related pain (acute) (chronic): Secondary | ICD-10-CM

## 2021-02-19 DIAGNOSIS — C8510 Unspecified B-cell lymphoma, unspecified site: Secondary | ICD-10-CM | POA: Diagnosis present

## 2021-02-19 DIAGNOSIS — Z8679 Personal history of other diseases of the circulatory system: Secondary | ICD-10-CM

## 2021-02-19 DIAGNOSIS — Z20822 Contact with and (suspected) exposure to covid-19: Secondary | ICD-10-CM | POA: Diagnosis not present

## 2021-02-19 DIAGNOSIS — Z9889 Other specified postprocedural states: Secondary | ICD-10-CM

## 2021-02-19 DIAGNOSIS — K529 Noninfective gastroenteritis and colitis, unspecified: Secondary | ICD-10-CM | POA: Diagnosis present

## 2021-02-19 DIAGNOSIS — E860 Dehydration: Secondary | ICD-10-CM | POA: Diagnosis not present

## 2021-02-19 DIAGNOSIS — F1190 Opioid use, unspecified, uncomplicated: Secondary | ICD-10-CM | POA: Diagnosis present

## 2021-02-19 DIAGNOSIS — E872 Acidosis, unspecified: Secondary | ICD-10-CM

## 2021-02-19 DIAGNOSIS — Z955 Presence of coronary angioplasty implant and graft: Secondary | ICD-10-CM

## 2021-02-19 DIAGNOSIS — Z951 Presence of aortocoronary bypass graft: Secondary | ICD-10-CM

## 2021-02-19 LAB — COMPREHENSIVE METABOLIC PANEL
ALT: 17 U/L (ref 0–44)
AST: 26 U/L (ref 15–41)
Albumin: 4.3 g/dL (ref 3.5–5.0)
Alkaline Phosphatase: 66 U/L (ref 38–126)
Anion gap: 11 (ref 5–15)
BUN: 17 mg/dL (ref 8–23)
CO2: 22 mmol/L (ref 22–32)
Calcium: 10.4 mg/dL — ABNORMAL HIGH (ref 8.9–10.3)
Chloride: 102 mmol/L (ref 98–111)
Creatinine, Ser: 0.95 mg/dL (ref 0.61–1.24)
GFR, Estimated: >60 mL/min (ref >60)
Glucose, Bld: 154 mg/dL — ABNORMAL HIGH (ref 70–99)
Potassium: 3.6 mmol/L (ref 3.5–5.1)
Sodium: 135 mmol/L (ref 135–145)
Total Bilirubin: 1 mg/dL (ref 0.3–1.2)
Total Protein: 8 g/dL (ref 6.5–8.1)

## 2021-02-19 LAB — CBC WITH DIFFERENTIAL/PLATELET
Abs Immature Granulocytes: 0.05 10*3/uL (ref 0.00–0.07)
Basophils Absolute: 0.1 10*3/uL (ref 0.0–0.1)
Basophils Relative: 1 %
Eosinophils Absolute: 0.2 10*3/uL (ref 0.0–0.5)
Eosinophils Relative: 1 %
HCT: 45.2 % (ref 39.0–52.0)
Hemoglobin: 16.6 g/dL (ref 13.0–17.0)
Immature Granulocytes: 0 %
Lymphocytes Relative: 27 %
Lymphs Abs: 4.2 10*3/uL — ABNORMAL HIGH (ref 0.7–4.0)
MCH: 33.2 pg (ref 26.0–34.0)
MCHC: 36.7 g/dL — ABNORMAL HIGH (ref 30.0–36.0)
MCV: 90.4 fL (ref 80.0–100.0)
Monocytes Absolute: 1.3 10*3/uL — ABNORMAL HIGH (ref 0.1–1.0)
Monocytes Relative: 8 %
Neutro Abs: 9.6 10*3/uL — ABNORMAL HIGH (ref 1.7–7.7)
Neutrophils Relative %: 63 %
Platelets: 412 10*3/uL — ABNORMAL HIGH (ref 150–400)
RBC: 5 MIL/uL (ref 4.22–5.81)
RDW: 14.1 % (ref 11.5–15.5)
WBC: 15.3 10*3/uL — ABNORMAL HIGH (ref 4.0–10.5)
nRBC: 0 % (ref 0.0–0.2)

## 2021-02-19 LAB — URINALYSIS, ROUTINE W REFLEX MICROSCOPIC
Bacteria, UA: NONE SEEN
Bilirubin Urine: NEGATIVE
Glucose, UA: NEGATIVE mg/dL
Ketones, ur: 20 mg/dL — AB
Leukocytes,Ua: NEGATIVE
Nitrite: NEGATIVE
Protein, ur: 30 mg/dL — AB
Specific Gravity, Urine: 1.027 (ref 1.005–1.030)
pH: 5 (ref 5.0–8.0)

## 2021-02-19 LAB — LACTIC ACID, PLASMA
Lactic Acid, Venous: 2.7 mmol/L (ref 0.5–1.9)
Lactic Acid, Venous: 2.3 mmol/L (ref 0.5–1.9)

## 2021-02-19 LAB — RESP PANEL BY RT-PCR (FLU A&B, COVID) ARPGX2
Influenza A by PCR: NEGATIVE
Influenza B by PCR: NEGATIVE
SARS Coronavirus 2 by RT PCR: NEGATIVE

## 2021-02-19 LAB — LIPASE, BLOOD: Lipase: 27 U/L (ref 11–51)

## 2021-02-19 MED ORDER — SODIUM CHLORIDE 0.9 % IV SOLN: Freq: Once | INTRAVENOUS | Status: DC

## 2021-02-19 MED ORDER — ONDANSETRON HCL 4 MG/2ML IJ SOLN: 4.0000 mg | Freq: Four times a day (QID) | INTRAMUSCULAR | Status: DC | PRN

## 2021-02-19 MED ORDER — SODIUM CHLORIDE 0.9 % IV BOLUS
1000.0000 mL | Freq: Once | INTRAVENOUS | Status: AC
  Administered 2021-02-19: 1000 mL via INTRAVENOUS

## 2021-02-19 MED ORDER — SODIUM CHLORIDE 0.9 % IV SOLN: Freq: Once | INTRAVENOUS | Status: AC

## 2021-02-19 MED ORDER — HYDROMORPHONE HCL 1 MG/ML IJ SOLN
0.5000 mg | INTRAMUSCULAR | Status: DC | PRN
  Administered 2021-02-19: 0.5 mg via INTRAVENOUS
  Filled 2021-02-19: qty 1

## 2021-02-19 MED ORDER — ENOXAPARIN SODIUM 40 MG/0.4ML IJ SOSY
40.0000 mg | PREFILLED_SYRINGE | Freq: Every day | INTRAMUSCULAR | Status: DC
  Administered 2021-02-20: 40 mg via SUBCUTANEOUS
  Filled 2021-02-19: qty 0.4

## 2021-02-19 MED ORDER — IOHEXOL 300 MG/ML  SOLN
100.0000 mL | Freq: Once | INTRAMUSCULAR | Status: AC | PRN
  Administered 2021-02-19: 100 mL via INTRAVENOUS

## 2021-02-19 MED ORDER — ONDANSETRON HCL 4 MG PO TABS: 4.0000 mg | ORAL_TABLET | Freq: Four times a day (QID) | ORAL | Status: DC | PRN

## 2021-02-19 MED ORDER — ACETAMINOPHEN 325 MG RE SUPP: 650.0000 mg | Freq: Four times a day (QID) | RECTAL | Status: DC | PRN

## 2021-02-19 MED ORDER — ACETAMINOPHEN 325 MG PO TABS: 650.0000 mg | ORAL_TABLET | Freq: Four times a day (QID) | ORAL | Status: DC | PRN

## 2021-02-19 MED ORDER — PANTOPRAZOLE SODIUM 40 MG PO TBEC
40.0000 mg | DELAYED_RELEASE_TABLET | Freq: Every day | ORAL | Status: DC
  Administered 2021-02-20: 40 mg via ORAL
  Filled 2021-02-19: qty 1

## 2021-02-19 NOTE — ED Provider Notes (Signed)
[facility] Provider Note    Event Date/Time   First MD Initiated Contact with Patient 02/19/21 2042     (approximate)   History   dehydration  HPI  Jordan Mason is a 76 y.o. male who on review of past medical history and recent outpatient oncology notes showing recent diagnosis of lymphoma presents to the ER for evaluation of poor p.o. intake loose stools fatigue and dehydration.  Feeling generalized malaise no measured fevers has had some chills.  Not currently on antibiotics.  States has been having multiple episodes of watery stool.  Some mild abdominal pain as well.     Physical Exam   Triage Vital Signs: ED Triage Vitals  Enc Vitals Group     BP 02/19/21 1545 (!) 159/109     Pulse Rate 02/19/21 1545 (!) 110     Resp 02/19/21 1545 (!) 24     Temp 02/19/21 1545 98.2 F (36.8 C)     Temp Source 02/19/21 2012 Oral     SpO2 02/19/21 1545 99 %     Weight --      Height --      Head Circumference --      Peak Flow --      Pain Score --      Pain Loc --      Pain Edu? --      Excl. in [place]? --     Most recent vital signs: Vitals:   02/19/21 1545 02/19/21 2012  BP: (!) 159/109 (!) 175/94  Pulse: (!) 110 (!) 113  Resp: (!) 24 20  Temp: 98.2 F (36.8 C) 98.4 F (36.9 C)  SpO2: 99% 98%     Constitutional: Alert  Eyes: Conjunctivae are normal.  Head: Atraumatic. Nose: No congestion/rhinnorhea. Mouth/Throat: Mucous membranes are moist.   Neck: Painless ROM.  Cardiovascular:   Good peripheral circulation. Respiratory: Normal respiratory effort.  No retractions.  Gastrointestinal: Soft and nontender.  Musculoskeletal:  no deformity Neurologic:  MAE spontaneously. No gross focal neurologic deficits are appreciated.  Skin:  Skin is warm, dry and intact. No rash noted. Psychiatric: Mood and affect are normal. Speech and behavior are normal.    ED Results / Procedures / Treatments   Labs (all labs ordered are listed, but only  abnormal results are displayed) Labs Reviewed  COMPREHENSIVE METABOLIC PANEL - Abnormal; Notable for the following components:      Result Value   Glucose, Bld 154 (*)    Calcium 10.4 (*)    All other components within normal limits  CBC WITH DIFFERENTIAL/PLATELET - Abnormal; Notable for the following components:   WBC 15.3 (*)    MCHC 36.7 (*)    Platelets 412 (*)    Neutro Abs 9.6 (*)    Lymphs Abs 4.2 (*)    Monocytes Absolute 1.3 (*)    All other components within normal limits  LACTIC ACID, PLASMA - Abnormal; Notable for the following components:   Lactic Acid, Venous 2.7 (*)    All other components within normal limits  LACTIC ACID, PLASMA - Abnormal; Notable for the following components:   Lactic Acid, Venous 2.3 (*)    All other components within normal limits  RESP PANEL BY RT-PCR (FLU A&B, COVID) [id number]  CULTURE, BLOOD (ROUTINE X 2)  CULTURE, BLOOD (ROUTINE X 2)  C DIFFICILE QUICK SCREEN W PCR REFLEX    GASTROINTESTINAL PANEL BY PCR, STOOL (REPLACES STOOL CULTURE)  LIPASE, BLOOD  URINALYSIS, ROUTINE  W REFLEX MICROSCOPIC     EKG     RADIOLOGY Please see ED Course for my review and interpretation.  I personally reviewed all radiographic images ordered to evaluate for the above acute complaints and reviewed radiology reports and findings.  These findings were personally discussed with the patient.  Please see medical record for radiology report.    PROCEDURES:  Critical Care performed:   Procedures   MEDICATIONS ORDERED IN ED: Medications  0.9 %  sodium chloride infusion (has no administration in time range)  sodium chloride 0.9 % bolus 1,000 mL (has no administration in time range)  0.9 %  sodium chloride infusion (has no administration in time range)  HYDROmorphone (DILAUDID) injection 0.5 mg (has no administration in time range)  sodium chloride 0.9 % bolus 1,000 mL (1,000 mLs Intravenous New Bag/Given 02/19/21 2111)  iohexol (OMNIPAQUE) 300 MG/ML  solution 100 mL (100 mLs Intravenous Contrast Given 02/19/21 2156)     IMPRESSION / MDM / ASSESSMENT AND PLAN / ED COURSE  I reviewed the triage vital signs and the nursing notes.                              Differential diagnosis includes, but is not limited to, dehydration, electrolyte abnormality, sepsis, SBO, colitis, perforation, mass  Patient presenting with symptoms as described above.  Clinically appears very dehydrated tachycardic but normotensive.  Lactate is mildly elevated repeat drawn prior to IV fluids still elevated.  Does have leukocytosis but with recent diagnosis of lymphoma.  Is not febrile think this presentation is more likely secondary to dehydration and sepsis have obtained cultures but will observe we will give IV fluids and reassess.   Clinical Course as of 02/19/21 2240  Sat Feb 19, 2021  2141 Chest x-ray by my review does not show any evidence of pneumothorax. [PR]  2235 Patient still appears clinically dehydrated.  Has not been able to make urine.  Will order additional IV fluids he is requesting for something for pain.  Review of CT imaging does not show evidence of acute intra-abdominal process. Ordered IV dilaudid for pain. Given his dehydration have consulted with hospitalist for admission. [PR]    Clinical Course User Index [PR] Merlyn Lot, MD     FINAL CLINICAL IMPRESSION(S) / ED DIAGNOSES   Final diagnoses:  Dehydration  Cancer associated pain     Rx / DC Orders   ED Discharge Orders     None        Note:  This document was prepared using Dragon voice recognition software and may include unintentional dictation errors.    Merlyn Lot, MD 02/19/21 2241

## 2021-02-19 NOTE — ED Provider Triage Note (Signed)
Emergency Medicine Provider Triage Evaluation Note  Jordan Mason , a 76 y.o. male  was evaluated in triage.  Pt complains of diarrhea, weight loss, decreased temperature.  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  Patient is under the care of oncology and is meeting on Tuesday for final diagnosis and treatment plan.  Patient has developed severe diarrhea, decreased appetite, decreased temp. Oncologist referred patient to the ED  Review of Systems  Positive: Temp 68F, diarrhea, decreased oral intake Negative:   Physical Exam  BP (!) 159/109    Pulse (!) 110    Temp 98.2 F (36.8 C)    Resp (!) 24    SpO2 99%  Gen:   Awake, no distress   Resp:  Normal effort  MSK:   Moves extremities without difficulty  Other:  Bowel sounds present.  No tenderness on exam.  Medical Decision Making  Medically screening exam initiated at 3:49 PM.  Appropriate orders placed.  Jordan Mason was informed that the remainder of the evaluation will be completed by another provider, this initial triage assessment does not replace that evaluation, and the importance of remaining in the ED until their evaluation is complete.  Patient arrives with diarrhea, decreased temperature, decreased oral intake.  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  Patient has not started on radiation or chemotherapy at this time.  Referred to the emergency department by oncologist due to symptoms.  Patient will labs, school samples, urine for evaluation at this time.   Jordan Moll, PA-C 02/19/21 1616

## 2021-02-19 NOTE — ED Notes (Signed)
Pt returned from CT °

## 2021-02-19 NOTE — ED Notes (Signed)
Charge RN notified of lactic and need for next bed

## 2021-02-19 NOTE — ED Notes (Signed)
Pt given cup of water to see how he will tolerate

## 2021-02-19 NOTE — ED Notes (Signed)
Stool sample obtained. Sample sent down to lab

## 2021-02-19 NOTE — ED Notes (Signed)
Pt states he is going to leave. Pt encouraged multiple times to stay and be seen. Pt continues to decline. Pt provided with explanation of AMA form, pt declines to sign ama, then states he is going to wait.

## 2021-02-19 NOTE — H&P (Signed)
History and Physical    BRITTAIN HOSIE CWC:[id number] DOB: [birth year] DOA: 02/19/2021  PCP: Donnamarie Rossetti, PA-C   Patient coming from: home  I have personally briefly reviewed patient's relevant medical records in [place]  Chief Complaint: diarrhea x 24hrs  HPI: Jordan Mason is a 76 y.o. male with medical history significant for CAD s/p CABG, Huntington's disease, HTN, depression, PAD, OSA, chronic back pain on chronic opiates, recently diagnosed with large B-cell lymphoma , last seen by oncology on 12/15, who presents to the ED with a several week history of watery diarrhea now associated with generalized abdominal cramping and low-grade temperature of 99 and decreased urinary output.  He has nausea with decreased oral intake but no vomiting.  Denies cough or shortness of breath  ED course: Afebrile, tachycardic to 113, tachypneic to 24 with SBP 159-175. Blood work WBC 15,000 with lactic acid 2.7-2.3.  CBC otherwise unremarkable CMP unremarkable Lipase 27 COVID and flu negative C. difficile and GI panel by PCR pending  CT abdomen and pelvis with no acute abnormality.  Moderate volume of stool throughout the colon which can be seen with constipation.  No change in known right sacral lesion  Patient given 2 L NS bolus in the ED hydromorphone hospitalist consulted for admission    Review of Systems: As per HPI otherwise all other systems on review of systems negative.   Assessment/Plan  Sepsis versus lactic acidosis Acute gastroenteritis vs overflow diarrhea Dehydration -Patient with tachycardia and tachypnea, leukocytosis and lactic acidosis - IV hydration, IV antiemetics and pain meds - Clear liquid diet - Follow procalcitonin, stool for C. 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) - Followed by oncology and neurosurgery - Had PET scan on 1/4 - Continue oxycodone    Coronary  artery disease s/p CABG s/p stent - No complaints of chest pain - Not currently on aspirin, beta-blocker or statin per med list pending med rec    Hx of repair of dissecting thoracic aortic aneurysm, Stanford type A -No acute disease suspected  Depression - Continue Lexapro and Latuda    DVT prophylaxis: Lovenox  Code Status: full code  Family Communication:  none  Disposition Plan: Back to previous home environment Consults called: none  Status:At the time of admission, it appears that the appropriate admission status for this patient is INPATIENT. This is judged to be reasonable and necessary in order to provide the required intensity of service to ensure the patient's safety given the presenting symptoms, physical exam findings, and initial radiographic and laboratory data in the context of their  Comorbid conditions.   Patient requires inpatient status due to high intensity of service, high risk for further deterioration and high frequency of surveillance required.   I certify that at the point of admission it is my clinical judgment that the patient will require inpatient hospital care spanning beyond 2 midnights     Physical Exam: Vitals:   02/19/21 1545 02/19/21 2012  BP: (!) 159/109 (!) 175/94  Pulse: (!) 110 (!) 113  Resp: (!) 24 20  Temp: 98.2 F (36.8 C) 98.4 F (36.9 C)  TempSrc:  Oral  SpO2: 99% 98%   Constitutional: Alert, oriented x 3 . Not in any apparent distress HEENT:      Head: Normocephalic and atraumatic.         Eyes: PERLA, EOMI, Conjunctivae are normal. Sclera is  non-icteric.       Mouth/Throat: Mucous membranes are moist.       Neck: Supple with no signs of meningismus. Cardiovascular: Regular rate and rhythm. No murmurs, gallops, or rubs. 2+ symmetrical distal pulses are present . No JVD. No  LE edema Respiratory: Respiratory effort normal .Lungs sounds clear bilaterally. No wheezes, crackles, or rhonchi.  Gastrointestinal: Soft, non tender, non  distended. Positive bowel sounds.  Genitourinary: No CVA tenderness. Musculoskeletal: Nontender with normal range of motion in all extremities. No cyanosis, or erythema of extremities. Neurologic:  Face is symmetric. Moving all extremities. No gross focal neurologic deficits . Skin: Skin is warm, dry.  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   a.)  s/p RIGHT sided PTA and stenting   MDD (major depressive disorder)    Myocardial infarction ([place]) 02/21/2006   Neuropathy    OSA (obstructive sleep apnea)    Peripheral vascular disease (HCC)    Postlaminectomy syndrome of lumbar region 09/21/2011   Postoperative atrial fibrillation (HCC)    Statin intolerance    Thoracic aortic aneurysm    s/p dissection and repair in 08/2012    Past Surgical History:  Procedure Laterality Date   ANGIOPLASTY / STENTING ILIAC Right 04/03/2006   Procedure(s): abdominal aortography, bilateral lower extremity runoff, iliac PTA and stenting; Location: [facility]; Surgeon: Dionne Ano, MD   BLEPHAROPLASTY Bilateral 05/06/2019   Procedure: BLEPHAROPLASTY, UPPER EYELID; WITH EXCESSIVE SKIN WEIGHTING DOWN LID; Surgeon: Lucilla Lame, MD; Location: [place]; Service: Ophthalmology; Laterality: Bilateral   CARDIAC CATHETERIZATION N/A 03/15/2006   Location: [facility]; Surgeon: Samella Parr, MD   CARDIAC CATHETERIZATION N/A 03/23/2011   Location: [facility]; Surgeon: Kathe Mariner, MD   CATARACT EXTRACTION W/ INTRAOCULAR LENS IMPLANT Right 03/04/2019   Procedure: EXTRACAPSULAR CATARACT PHACO REMOVAL WITH INSERTION OF INTRAOCULAR LENS  PROSTHESIS (1 STAGE PROCEDURE), MANUAL OR MECHANICAL TECHNIQUE; Surgeon: Christean Grief, MD; Location: [facility]; Service: Ophthalmology; Laterality: Right; incision approximated   CATARACT EXTRACTION W/ INTRAOCULAR LENS IMPLANT Left 02/18/2019   Procedure: DEXTENZA- EXTRACAPSULAR CATARACT ECCE REMOVAL WITH INSERTION OF INTRAOCULAR LENS PROSTHESIS (1 STAGE PROCEDURE), MANUAL OR MECHANICAL TECHNIQUE; Surgeon: Christean Grief, MD; Location: [facility]; Service: Ophthalmology; Laterality: Left   COLONOSCOPY     CORONARY ARTERY BYPASS GRAFT N/A 03/29/2011   4v; LIMA-LAD, SVG-RI, SVG-D1, SVG-RCA   ELECTROCONVULSIVE THERAPY N/A    Multiple treatments   FRACTURE SURGERY     IR IMAGING GUIDED PORT INSERTION  02/11/2021   LUMBAR LAMINECTOMY N/A 02/2004   L4-L5 and L5-S1   PERCUTANEOUS CORONARY STENT INTERVENTION (PCI-S) N/A 02/21/2006   Procedure: PCI with 3.5 x 16 mm Liberte BMS x 1 to RCA; Location: [facility]; Surgeon: Dionne Ano, MD   PERCUTANEOUS CORONARY STENT INTERVENTION (PCI-S) N/A 09/18/2006   Procedure: overlapping 3.5 x 18 mm Xience DES to in stent of BMS in RCA; Location: [facility]; Surgeon: Lovena Neighbours, MD   REPAIR OF ACUTE ASCENDING THORACIC AORTIC DISSECTION N/A 08/14/2012   Redo sternotomy for  repair Type A dissection with ascending aortic graft, aortic valve resuspension, aortic valve central plication of non-coronary cusp, STJ remodeling; Location: [facility]; Surgeon: Dr. Cheree Ditto   REVISION TOTAL KNEE ARTHROPLASTY Right 11/2009   TONSILLECTOMY     TOTAL HIP ARTHROPLASTY Right 08/24/2020   Procedure: TOTAL HIP ARTHROPLASTY ANTERIOR APPROACH;  Surgeon: Hessie Knows, MD;  Location: [facility];  Service: Orthopedics;  Laterality: Right;   TOTAL KNEE ARTHROPLASTY Right 02/2008     reports that he quit smoking about 9 years ago. His smoking use included cigars. He has never used smokeless tobacco. He reports that he does not currently use alcohol. He reports that he does not use drugs.  Allergies   Allergen Reactions   Vortioxetine Other (See Comments)    Serotonin syndrome     Atorvastatin Other (See Comments)     Muscle Pain   Bupropion Other (See Comments)    vision problem     Morphine Nausea Only and Other (See Comments)    "dreams"    Pravastatin Other (See Comments)     Muscle Pain    Rosuvastatin Other (See Comments)    Muscle Pain   Felodipine Other (See Comments)     sexual dysfunction     Hydrochlorothiazide W-Triamterene Other (See Comments)    leg cramps     Lisinopril Other (See Comments)     jitteriness     Metoprolol Other (See Comments)    sexual dys, abn tongue sens.      No family history on file.    Prior to Admission medications   Medication Sig Start Date End Date Taking? Authorizing Provider  amphetamine-dextroamphetamine (ADDERALL) 15 MG tablet Take 15 tablets by mouth 2 (two) times daily. 07/19/20   [provider]  AUSTEDO 9 MG TABS Take 9 mg by mouth 2 (two) times daily. 07/28/20   [provider]  benazepril (LOTENSIN) 20 MG tablet Take 20 mg by mouth at bedtime. 06/26/20   [provider]  celecoxib (CELEBREX) 200 MG capsule Take 1 capsule by mouth 2 (two) times daily. 12/03/20   [provider]  cetirizine (ZYRTEC) 10 MG tablet Take 10 mg by mouth daily. 12/28/20   [provider]  Cholecalciferol (VITAMIN D3 SUPER STRENGTH) 50 MCG (2000 UT) TABS Take 2,000 Units by mouth daily.    [provider]  enoxaparin (LOVENOX) 40 MG/0.4ML injection Inject 0.4 mLs (40 mg total) into the skin daily for 14 days. 08/25/20 02/04/21  Reche Dixon, PA-C  escitalopram (LEXAPRO) 20 MG tablet Take 20 mg by mouth at bedtime. 05/05/20   [provider]  fluticasone (FLONASE) 50 MCG/ACT nasal spray Place 2 sprays into the nose daily.    [provider]  HYDROcodone-acetaminophen (NORCO/VICODIN) 5-325 MG tablet Take 1-2 tablets by mouth every 6 (six) hours as needed for moderate pain  (pain score 4-6). 02/14/21   Sindy Guadeloupe, MD  ibuprofen (ADVIL) 200 MG tablet Take 800 mg by mouth daily as needed.    [provider]  LATUDA 40 MG TABS tablet Take 40 mg by mouth at bedtime. 03/05/20   [provider]  Multiple Vitamins-Minerals (MULTIVITAMIN WITH MINERALS) tablet Take 1 tablet by mouth daily.    [provider]  Oxycodone HCl 10 MG TABS Take 1 tablet (10 mg total) by mouth 2 (two) times daily as needed. 02/10/21   Lloyd Huger, MD  pantoprazole (PROTONIX) 40 MG tablet Take 40 mg by mouth daily. 07/21/20   [provider]  polyethylene glycol (MIRALAX / GLYCOLAX) 17 g packet Take 17 g by mouth daily.    [provider]  SYMBICORT 80-4.5 MCG/ACT inhaler Inhale 2 puffs into the lungs 2 (two) times daily. 06/18/20   [provider]  testosterone cypionate (DEPOTESTOSTERONE CYPIONATE) 200 MG/ML injection Inject 200 mg into the muscle every 14 (fourteen) days. 10/05/16   [provider]  TIADYLT ER 360 MG 24 hr capsule Take 360 mg by mouth at bedtime. 06/26/20   [provider]  traMADol (ULTRAM) 50 MG tablet Take 1 tablet (50 mg total) by mouth every 6 (six) hours. Patient not taking: Reported on 02/09/2021 08/25/20   Reche Dixon, PA-C  VASCEPA 1 g capsule Take 2 g by mouth 2 (two) times daily. 03/16/20   [provider]      Labs on Admission: I have personally reviewed following labs and imaging studies  CBC: Recent Labs  Lab 02/19/21 1604  WBC 15.3*  NEUTROABS 9.6*  HGB 16.6  HCT 45.2  MCV 90.4  PLT 540*   Basic Metabolic Panel: Recent Labs  Lab 02/19/21 1604  NA 135  K 3.6  CL 102  CO2 22  GLUCOSE 154*  BUN 17  CREATININE 0.95  CALCIUM 10.4*   GFR: Estimated Creatinine Clearance: 68.2 mL/min (by C-G formula based on SCr of 0.95 mg/dL). Liver Function Tests: Recent Labs  Lab 02/19/21 1604  AST 26  ALT 17  ALKPHOS 66  BILITOT 1.0  PROT 8.0  ALBUMIN 4.3   Recent Labs   Lab 02/19/21 1604  LIPASE 27   No results for input(s): AMMONIA in the last 168 hours. Coagulation Profile: No results for input(s): INR, PROTIME in the last 168 hours. Cardiac Enzymes: No results for input(s): CKTOTAL, CKMB, CKMBINDEX, TROPONINI in the last 168 hours. BNP (last 3 results) No results for input(s): PROBNP in the last 8760 hours. HbA1C: No results for input(s): HGBA1C in the last 72 hours. CBG: Recent Labs  Lab 02/16/21 0827  GLUCAP 139*   Lipid Profile: No results for input(s): CHOL, HDL, LDLCALC, TRIG, CHOLHDL, LDLDIRECT in the last 72 hours. Thyroid Function Tests: No results for input(s): TSH, T4TOTAL, FREET4, T3FREE, THYROIDAB in the last 72 hours. Anemia Panel: No results for input(s): VITAMINB12, FOLATE, FERRITIN, TIBC, IRON, RETICCTPCT in the last 72 hours. Urine analysis:    Component Value Date/Time   COLORURINE YELLOW (A) 08/12/2020 1142   APPEARANCEUR HAZY (A) 08/12/2020 1142   LABSPEC 1.014 08/12/2020 1142   PHURINE 7.0 08/12/2020 1142   GLUCOSEU NEGATIVE 08/12/2020 1142   HGBUR NEGATIVE 08/12/2020 1142   BILIRUBINUR NEGATIVE 08/12/2020 1142   KETONESUR NEGATIVE 08/12/2020 1142   PROTEINUR NEGATIVE 08/12/2020 1142   NITRITE NEGATIVE 08/12/2020 [place] 08/12/2020 1142    Radiological Exams on Admission: CT ABDOMEN PELVIS W CONTRAST  Result Date: 02/19/2021 CLINICAL DATA:  Nausea and vomiting. Diarrhea and decreased appetite. EXAM: CT ABDOMEN AND PELVIS WITH CONTRAST TECHNIQUE: Multidetector CT imaging of the abdomen and pelvis was performed using the standard protocol following bolus administration of intravenous contrast. CONTRAST:  170mL OMNIPAQUE IOHEXOL 300 MG/ML  SOLN COMPARISON:  PET CT 3 days ago.  Chest abdomen pelvis CT 02/02/2021 FINDINGS: Lower chest: Subsegmental atelectasis or scar in the right lower lobe. No pleural effusion or acute airspace disease. Unchanged hiatal hernia. Hepatobiliary: Focal fatty  infiltration adjacent to the falciform ligament. No focal liver lesion. Mild gallbladder distention without calcified gallstone or pericholecystic fat stranding. No biliary  dilatation. Pancreas: No ductal dilatation or inflammation. Spleen: Normal in size without focal abnormality. Adrenals/Urinary Tract: Normal adrenal glands. Lobulated bilateral renal contours without hydronephrosis. No perinephric edema. Homogeneous renal enhancement with symmetric excretion on delayed phase imaging. No focal renal abnormality or visualized stone. Unremarkable urinary bladder. Stomach/Bowel: Moderate hiatal hernia. Small duodenal diverticulum. No small bowel obstruction or abnormal distention. Occasional fluid-filled distal small bowel without perienteric fat stranding. High-riding cecum in the right mid abdomen. The appendix is normal. Moderate volume of stool throughout the colon. Colonic diverticulosis. No diverticulitis or acute colonic inflammation. Vascular/Lymphatic: Moderately advanced aortic atherosclerosis. Bi-iliac tortuosity. Patent portal and splenic veins. No acute vascular findings. No enlarged abdominopelvic lymph nodes. Reproductive: Unremarkable prostate gland. Other: No ascites, free air or focal fluid collection. Tiny fat containing umbilical hernia. No omental thickening. Musculoskeletal: Expansile right sacral lesion which has been recently assessed with PET. Ghost tracks within L5-S1. Diffuse lumbar degenerative change. Right hip arthroplasty IMPRESSION: 1. No acute abnormality in the abdomen/pelvis. 2. Moderate volume of stool throughout the colon, can be seen with constipation. Colonic diverticulosis without diverticulitis. 3. Moderate hiatal hernia. 4. Known right sacral lesion is unchanged in the short interim from prior imaging. Aortic Atherosclerosis (ICD10-I70.0). Electronically Signed   By: Keith Rake M.D.   On: 02/19/2021 22:19   DG Chest Portable 1 View  Result Date: 02/19/2021 CLINICAL  DATA:  Evaluate for infiltrate. EXAM: PORTABLE CHEST 1 VIEW COMPARISON:  CT dated 02/02/2021. FINDINGS: Right-sided Port-A-Cath with tip over central SVC. Minimal bibasilar atelectasis. No focal consolidation, pleural effusion, pneumothorax. The cardiac silhouette is within limits. Atherosclerotic calcification of the aorta. Median sternotomy wires and CABG vascular clips. No acute osseous pathology. IMPRESSION: No active disease. Electronically Signed   By: Anner Crete M.D.   On: 02/19/2021 21:43       Athena Masse MD [facility]   02/19/2021, 11:05 PM

## 2021-02-19 NOTE — ED Notes (Signed)
Straight cath performed. 400 ml urine noted in bag

## 2021-02-19 NOTE — ED Notes (Signed)
ED Provider at bedside. 

## 2021-02-19 NOTE — ED Notes (Signed)
Dr. Quentin Cornwall notified of lactic 2.3 no new orders received.

## 2021-02-19 NOTE — ED Triage Notes (Signed)
Pt here with reports of recently being dx with cancer and has had severe diarrhea X24 hours. Pt denies abdominal pain. Family reports temp of 99. Pt also with decreased appetite and has been losing weight over the last few weeks.

## 2021-02-20 DIAGNOSIS — F1190 Opioid use, unspecified, uncomplicated: Secondary | ICD-10-CM

## 2021-02-20 DIAGNOSIS — R197 Diarrhea, unspecified: Secondary | ICD-10-CM | POA: Diagnosis not present

## 2021-02-20 DIAGNOSIS — E860 Dehydration: Principal | ICD-10-CM

## 2021-02-20 DIAGNOSIS — C8510 Unspecified B-cell lymphoma, unspecified site: Secondary | ICD-10-CM

## 2021-02-20 LAB — GASTROINTESTINAL PANEL BY PCR, STOOL (REPLACES STOOL CULTURE)

## 2021-02-20 LAB — CBC
HCT: 39.9 % (ref 39.0–52.0)
Hemoglobin: 14.3 g/dL (ref 13.0–17.0)
MCH: 32.3 pg (ref 26.0–34.0)
MCHC: 35.8 g/dL (ref 30.0–36.0)
MCV: 90.1 fL (ref 80.0–100.0)
Platelets: 361 10*3/uL (ref 150–400)
RBC: 4.43 MIL/uL (ref 4.22–5.81)
RDW: 14.1 % (ref 11.5–15.5)
WBC: 14.2 10*3/uL — ABNORMAL HIGH (ref 4.0–10.5)
nRBC: 0 % (ref 0.0–0.2)

## 2021-02-20 LAB — CBG MONITORING, ED: Glucose-Capillary: 169 mg/dL — ABNORMAL HIGH (ref 70–99)

## 2021-02-20 LAB — C DIFFICILE QUICK SCREEN W PCR REFLEX
C Diff antigen: NEGATIVE
C Diff interpretation: NOT DETECTED
C Diff toxin: NEGATIVE

## 2021-02-20 LAB — CORTISOL-AM, BLOOD: Cortisol - AM: 14.5 ug/dL (ref 6.7–22.6)

## 2021-02-20 LAB — CREATININE, SERUM
Creatinine, Ser: 0.84 mg/dL (ref 0.61–1.24)
GFR, Estimated: >60 mL/min (ref >60)

## 2021-02-20 LAB — PROCALCITONIN: Procalcitonin: <0.1 ng/mL

## 2021-02-20 LAB — PROTIME-INR
INR: 1 (ref 0.8–1.2)
Prothrombin Time: 13.5 seconds (ref 11.4–15.2)

## 2021-02-20 MED ORDER — POLYETHYLENE GLYCOL 3350 17 G PO PACK: 17.0000 g | PACK | Freq: Two times a day (BID) | ORAL | Status: DC

## 2021-02-20 MED ORDER — POLYETHYLENE GLYCOL 3350 17 G PO PACK: 17.0000 g | PACK | Freq: Every day | ORAL | 1 refills | Status: AC

## 2021-02-20 MED ORDER — BENAZEPRIL HCL 20 MG PO TABS
20.0000 mg | ORAL_TABLET | Freq: Every day | ORAL | Status: DC
  Administered 2021-02-20: 20 mg via ORAL
  Filled 2021-02-20: qty 1

## 2021-02-20 MED ORDER — HYDROCODONE-ACETAMINOPHEN 5-325 MG PO TABS: 1.0000 | ORAL_TABLET | Freq: Four times a day (QID) | ORAL | 0 refills | Status: DC | PRN

## 2021-02-20 NOTE — Discharge Summary (Signed)
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  History of present illness:  76 year old male with history of CAD s/p CABG, Huntington disease, hypertension, depression, PAD, OSA, chronic back pain on chronic opioids, recently diagnosed with large B-cell lymphoma last seen by oncology on 12/15 came to ED with several days of watery stool.  Also complained of generalized abdominal cramping. Blood work showed WBC 15,000, lactic acid 2.3  GI pathogen panel and stool for C. difficile PCR were negative  Hospital Course:   Loose stools Patient said that he was taking MiraLAX daily along with Vicodin to avoid constipation, but he stopped taking MiraLAX 2 weeks ago.  Since then he has been having these liquid stools.  CT abdomen/pelvis shows moderate stool burden in colon.  Patient had large formed BM last night.  Stool for C. difficile PCR as well as GI pathogen panel are negative for any infectious etiology. Patient is constipated with large stool burden in colon.  He had large BM and feeling better.  We will discharge him home on MiraLAX 17 g daily.  Continue Vicodin as needed for pain.  Dehydration -Resolved after IV fluids were given in the ED  Large B-cell lymphoma -Patient was recently  diagnosed with large B-cell lymphoma with sacral mass -He is supposed to follow-up with oncology in 1 week -Continue Vicodin as needed for pain  CAD s/p CABG -No complaints of chest pain  Depression -Continue Lexapro, Latuda  Procedures:   Consultations:   Discharge Exam: Vitals:   02/20/21 1230 02/20/21 1245  BP: 122/76   Pulse:  (!) 109  Resp:    Temp:    SpO2:  98%    General: Appears in no acute distress Cardiovascular: S1-S2, regular, no murmur auscultated Respiratory: Clear to auscultation bilaterally Abdomen-soft, nontender, no organomegaly  Discharge Instructions   Discharge Instructions     Diet - low sodium heart healthy   Complete by: As directed    Increase activity slowly   Complete by: As directed       Allergies as of 02/20/2021       Reactions   Vortioxetine Other (See Comments)   Serotonin syndrome   Atorvastatin Other (See Comments)    Muscle Pain   Bupropion Other (See Comments)   vision problem   Morphine Nausea Only, Other (See Comments)   "dreams"   Pravastatin Other (See Comments)    Muscle Pain   Rosuvastatin Other (See Comments)   Muscle Pain   Felodipine Other (See Comments)    sexual dysfunction   Hydrochlorothiazide W-triamterene Other (See Comments)   leg cramps   Lisinopril Other (See Comments)    jitteriness   Metoprolol Other (See Comments)   sexual dys, abn tongue sens.        Medication List  STOP taking these medications    enoxaparin 40 MG/0.4ML injection Commonly known as: LOVENOX   Oxycodone HCl 10 MG Tabs       TAKE these medications    amphetamine-dextroamphetamine 20 MG tablet Commonly known as: ADDERALL Take 20 mg by mouth 2 (two) times daily. What changed: Another medication with the same name was removed. Continue taking this medication, and follow the directions you see here.   Austedo 9 MG Tabs Generic drug: Deutetrabenazine Take 9 mg by mouth 2 (two) times daily.   benazepril  20 MG tablet Commonly known as: LOTENSIN Take 20 mg by mouth at bedtime.   celecoxib 200 MG capsule Commonly known as: CELEBREX Take 1 capsule by mouth 2 (two) times daily.   cetirizine 10 MG tablet Commonly known as: ZYRTEC Take 10 mg by mouth daily as needed for allergies.   escitalopram 20 MG tablet Commonly known as: LEXAPRO Take 20 mg by mouth at bedtime.   fluticasone 50 MCG/ACT nasal spray Commonly known as: FLONASE Place 2 sprays into the nose daily.   HYDROcodone-acetaminophen 5-325 MG tablet Commonly known as: NORCO/VICODIN Take 1-2 tablets by mouth every 6 (six) hours as needed for moderate pain (pain score 4-6).   ibuprofen 200 MG tablet Commonly known as: ADVIL Take 800 mg by mouth daily as needed.   Latuda 40 MG Tabs tablet Generic drug: lurasidone Take 40 mg by mouth at bedtime.   multivitamin with minerals tablet Take 1 tablet by mouth daily.   pantoprazole 40 MG tablet Commonly known as: PROTONIX Take 40 mg by mouth daily.   polyethylene glycol 17 g packet Commonly known as: MIRALAX / GLYCOLAX Take 17 g by mouth daily.   Symbicort 80-4.5 MCG/ACT inhaler Generic drug: budesonide-formoterol Inhale 2 puffs into the lungs 2 (two) times daily.   testosterone cypionate 200 MG/ML injection Commonly known as: DEPOTESTOSTERONE CYPIONATE Inject 200 mg into the muscle every 14 (fourteen) days.   Tiadylt ER 360 MG 24 hr capsule Generic drug: diltiazem Take 360 mg by mouth at bedtime.   traMADol 50 MG tablet Commonly known as: ULTRAM Take 1 tablet (50 mg total) by mouth every 6 (six) hours. What changed:  when to take this reasons to take this   Vascepa 1 g capsule Generic drug: icosapent Ethyl Take 2 g by mouth 2 (two) times daily.   Vitamin D3 Super Strength 50 MCG (2000 UT) Tabs Generic drug: Cholecalciferol Take 2,000 Units by mouth daily.       Allergies  Allergen Reactions   Vortioxetine Other (See Comments)    Serotonin  syndrome     Atorvastatin Other (See Comments)     Muscle Pain   Bupropion Other (See Comments)    vision problem     Morphine Nausea Only and Other (See Comments)    "dreams"    Pravastatin Other (See Comments)     Muscle Pain    Rosuvastatin Other (See Comments)    Muscle Pain   Felodipine Other (See Comments)     sexual dysfunction     Hydrochlorothiazide W-Triamterene Other (See Comments)    leg cramps     Lisinopril Other (See Comments)     jitteriness     Metoprolol Other (See Comments)    sexual dys, abn tongue sens.        The results of significant diagnostics from this hospitalization (including imaging, microbiology, ancillary and laboratory) are listed below for reference.    Significant Diagnostic Studies: MR CERVICAL SPINE  W WO CONTRAST  Result Date: 01/28/2021 CLINICAL DATA:  Right hip pain, history of cancer EXAM: MRI CERVICAL AND THORACIC SPINE WITHOUT AND WITH CONTRAST TECHNIQUE: Multiplanar and multiecho pulse sequences of the cervical spine, to include the craniocervical junction and cervicothoracic junction, and the thoracic spine, were obtained without and with intravenous contrast. CONTRAST:  24m GADAVIST GADOBUTROL 1 MMOL/ML IV SOLN COMPARISON:  No prior cervical or thoracic imaging. FINDINGS: Evaluation is somewhat limited by motion artifact. MRI CERVICAL SPINE FINDINGS Alignment: Trace retrolisthesis C3 on C4. Vertebrae: No acute fracture or suspicious osseous lesion. T1 and T2 hyperintense foci, most prominently in the C7 vertebral body, consistent with benign hemangiomas. No abnormal vertebral body enhancement. Cord: Normal signal and morphology.  No abnormal enhancement. Posterior Fossa, vertebral arteries, paraspinal tissues: Negative. Disc levels: C2-C3: Mild disc bulge. No spinal canal stenosis. Facet arthropathy. Mild bilateral neural foraminal narrowing. C3-C4: Disc bulge with superimposed central protrusion, which abuts the ventral cord.  Moderate spinal canal stenosis. Uncovertebral and facet arthropathy. Moderate bilateral neural foraminal narrowing. C4-C5: Osseous fusion across the disc space, with central osseous protrusion, which abuts the ventral cord. Mild spinal canal stenosis. Uncovertebral and facet arthropathy. Mild bilateral neural foraminal narrowing. C5-C6: Moderate disc bulge. Moderate spinal canal stenosis. Uncovertebral and facet arthropathy. Moderate bilateral neural foraminal narrowing. C6-C7: Broad-based disc bulge. Mild spinal canal stenosis. Uncovertebral and facet arthropathy. Moderate to severe bilateral neural foraminal narrowing. C7-T1: No significant disc bulge. No spinal canal stenosis or neuroforaminal narrowing. MRI THORACIC SPINE FINDINGS Alignment:  No significant listhesis. Vertebrae: No acute fracture. Additional T1 and T2 hyperintense foci in the vertebral bodies, consistent with benign hemangiomas. Osseous fusion of T8 and T9, likely degenerative. No abnormal enhancement. Cord:  Normal signal and morphology.  No abnormal enhancement. Paraspinal and other soft tissues: Negative. Disc levels: Multilevel degenerative changes, with small disc bulges at T2-T3, T5-T6, and T11-T12, but without significant spinal canal stenosis. Moderate bilateral neural foraminal narrowing at T2-T3 IMPRESSION: 1. No evidence of metastatic disease in the cervical or thoracic spine. 2. C3-C4 and C5-C6 moderate spinal canal stenosis and moderate bilateral neural foraminal narrowing. 3. C6-C7 mild spinal canal stenosis and moderate to severe bilateral neural foraminal narrowing. 4. C4-C5 mild spinal canal stenosis and mild bilateral neural foraminal narrowing. 5. No spinal canal stenosis in the thoracic spine. T2-T3 moderate bilateral neural foraminal narrowing. Electronically Signed   By: AMerilyn BabaM.D.   On: 01/28/2021 19:54   MR THORACIC SPINE W WO CONTRAST  Result Date: 01/28/2021 CLINICAL DATA:  Right hip pain, history of cancer  EXAM: MRI CERVICAL AND THORACIC SPINE WITHOUT AND WITH CONTRAST TECHNIQUE: Multiplanar and multiecho pulse sequences of the cervical spine, to include the craniocervical junction and cervicothoracic junction, and the thoracic spine, were obtained without and with intravenous contrast. CONTRAST:  735mGADAVIST GADOBUTROL 1 MMOL/ML IV SOLN COMPARISON:  No prior cervical or thoracic imaging. FINDINGS: Evaluation is somewhat limited by motion artifact. MRI CERVICAL SPINE FINDINGS Alignment: Trace retrolisthesis C3 on C4. Vertebrae: No acute fracture or suspicious osseous lesion. T1 and T2 hyperintense foci, most prominently in the C7 vertebral body, consistent with benign hemangiomas. No abnormal vertebral body enhancement. Cord: Normal signal and morphology.  No abnormal enhancement. Posterior Fossa, vertebral arteries, paraspinal tissues: Negative. Disc levels: C2-C3: Mild disc bulge. No spinal canal stenosis. Facet arthropathy. Mild bilateral neural foraminal narrowing. C3-C4: Disc bulge with superimposed central protrusion, which abuts the ventral cord. Moderate spinal canal stenosis. Uncovertebral and facet arthropathy. Moderate bilateral neural foraminal  narrowing. C4-C5: Osseous fusion across the disc space, with central osseous protrusion, which abuts the ventral cord. Mild spinal canal stenosis. Uncovertebral and facet arthropathy. Mild bilateral neural foraminal narrowing. C5-C6: Moderate disc bulge. Moderate spinal canal stenosis. Uncovertebral and facet arthropathy. Moderate bilateral neural foraminal narrowing. C6-C7: Broad-based disc bulge. Mild spinal canal stenosis. Uncovertebral and facet arthropathy. Moderate to severe bilateral neural foraminal narrowing. C7-T1: No significant disc bulge. No spinal canal stenosis or neuroforaminal narrowing. MRI THORACIC SPINE FINDINGS Alignment:  No significant listhesis. Vertebrae: No acute fracture. Additional T1 and T2 hyperintense foci in the vertebral bodies,  consistent with benign hemangiomas. Osseous fusion of T8 and T9, likely degenerative. No abnormal enhancement. Cord:  Normal signal and morphology.  No abnormal enhancement. Paraspinal and other soft tissues: Negative. Disc levels: Multilevel degenerative changes, with small disc bulges at T2-T3, T5-T6, and T11-T12, but without significant spinal canal stenosis. Moderate bilateral neural foraminal narrowing at T2-T3 IMPRESSION: 1. No evidence of metastatic disease in the cervical or thoracic spine. 2. C3-C4 and C5-C6 moderate spinal canal stenosis and moderate bilateral neural foraminal narrowing. 3. C6-C7 mild spinal canal stenosis and moderate to severe bilateral neural foraminal narrowing. 4. C4-C5 mild spinal canal stenosis and mild bilateral neural foraminal narrowing. 5. No spinal canal stenosis in the thoracic spine. T2-T3 moderate bilateral neural foraminal narrowing. Electronically Signed   By: Merilyn Baba M.D.   On: 01/28/2021 19:54   CT ABDOMEN PELVIS W CONTRAST  Result Date: 02/19/2021 CLINICAL DATA:  Nausea and vomiting. Diarrhea and decreased appetite. EXAM: CT ABDOMEN AND PELVIS WITH CONTRAST TECHNIQUE: Multidetector CT imaging of the abdomen and pelvis was performed using the standard protocol following bolus administration of intravenous contrast. CONTRAST:  147m OMNIPAQUE IOHEXOL 300 MG/ML  SOLN COMPARISON:  PET CT 3 days ago.  Chest abdomen pelvis CT 02/02/2021 FINDINGS: Lower chest: Subsegmental atelectasis or scar in the right lower lobe. No pleural effusion or acute airspace disease. Unchanged hiatal hernia. Hepatobiliary: Focal fatty infiltration adjacent to the falciform ligament. No focal liver lesion. Mild gallbladder distention without calcified gallstone or pericholecystic fat stranding. No biliary dilatation. Pancreas: No ductal dilatation or inflammation. Spleen: Normal in size without focal abnormality. Adrenals/Urinary Tract: Normal adrenal glands. Lobulated bilateral renal  contours without hydronephrosis. No perinephric edema. Homogeneous renal enhancement with symmetric excretion on delayed phase imaging. No focal renal abnormality or visualized stone. Unremarkable urinary bladder. Stomach/Bowel: Moderate hiatal hernia. Small duodenal diverticulum. No small bowel obstruction or abnormal distention. Occasional fluid-filled distal small bowel without perienteric fat stranding. High-riding cecum in the right mid abdomen. The appendix is normal. Moderate volume of stool throughout the colon. Colonic diverticulosis. No diverticulitis or acute colonic inflammation. Vascular/Lymphatic: Moderately advanced aortic atherosclerosis. Bi-iliac tortuosity. Patent portal and splenic veins. No acute vascular findings. No enlarged abdominopelvic lymph nodes. Reproductive: Unremarkable prostate gland. Other: No ascites, free air or focal fluid collection. Tiny fat containing umbilical hernia. No omental thickening. Musculoskeletal: Expansile right sacral lesion which has been recently assessed with PET. Ghost tracks within L5-S1. Diffuse lumbar degenerative change. Right hip arthroplasty IMPRESSION: 1. No acute abnormality in the abdomen/pelvis. 2. Moderate volume of stool throughout the colon, can be seen with constipation. Colonic diverticulosis without diverticulitis. 3. Moderate hiatal hernia. 4. Known right sacral lesion is unchanged in the short interim from prior imaging. Aortic Atherosclerosis (ICD10-I70.0). Electronically Signed   By: MKeith RakeM.D.   On: 02/19/2021 22:19   CT CHEST ABDOMEN PELVIS W CONTRAST  Result Date: 02/02/2021 CLINICAL DATA:  LEFT sacral  mass.  Large B-cell lymphoma on biopsy. EXAM: CT CHEST, ABDOMEN, AND PELVIS WITH CONTRAST TECHNIQUE: Multidetector CT imaging of the chest, abdomen and pelvis was performed following the standard protocol during bolus administration of intravenous contrast. CONTRAST:  16m OMNIPAQUE IOHEXOL 300 MG/ML  SOLN COMPARISON:   Biopsy procedural CT 01/24/2021, lumbar spine MRI 01/12/2021 FINDINGS: CT CHEST FINDINGS Cardiovascular: Post CABG. No acute cardiac findings vascular findings. Mediastinum/Nodes: No axillary or supraclavicular adenopathy. No mediastinal or hilar adenopathy. No pericardial fluid. Esophagus normal. Moderate size sliding-type hiatal hernia. Lungs/Pleura: No suspicious pulmonary nodules. Nodule pleural parenchymal thickening in the posterior upper lobes is favored benign. Airways normal Musculoskeletal: No aggressive osseous lesion of thorax. Midline sternotomy. CT ABDOMEN AND PELVIS FINDINGS Hepatobiliary: No focal hepatic lesion. No biliary ductal dilatation. Gallbladder is normal. Common bile duct is normal. Pancreas: Pancreas is normal. No ductal dilatation. No pancreatic inflammation. Spleen: Normal spleen Adrenals/urinary tract: Adrenal glands and kidneys are normal. The ureters and bladder normal. Stomach/Bowel: Stomach, small bowel, appendix, and cecum are normal. Multiple diverticula of the descending colon and sigmoid colon without acute inflammation. Vascular/Lymphatic: Abdominal aorta is normal caliber with atherosclerotic calcification. There is no retroperitoneal or periportal lymphadenopathy. No pelvic lymphadenopathy. Reproductive: Unremarkable Other: No peritoneal disease Musculoskeletal: Parasacral mass with erosion into the posterior elements of the L5-S1 vertebral body measures 8.1 by 4.0 cm (image 91/2. No additional skeletal lesions are identified Post RIGHT hip arthroplasty. IMPRESSION: Chest Impression: 1. No evidence of thoracic metastasis. 2. No evidence of skeletal metastasis in the thorax. 3. Small hiatal hernia. 4. Post CABG. Abdomen / Pelvis Impression: 1. Large sacral and parasacral mass as described on comparison MRI. 2. No additional skeletal lesions are identified. 3. No evidence of metastatic adenopathy in the abdomen pelvis. 4. In patient with lymphoma, FDG PET scan and may be  appropriate for pre therapy staging and baseline for follow-up of treatment. Electronically Signed   By: SSuzy BouchardM.D.   On: 02/02/2021 16:47   NM PET Image Initial (PI) Skull Base To Thigh  Result Date: 02/16/2021 CLINICAL DATA:  Subsequent treatment strategy for sacral mass, lymphoma. EXAM: NUCLEAR MEDICINE PET SKULL BASE TO THIGH TECHNIQUE: 9.1 mCi F-18 FDG was injected intravenously. Full-ring PET imaging was performed from the skull base to thigh after the radiotracer. CT data was obtained and used for attenuation correction and anatomic localization. Fasting blood glucose: 139 mg/dl COMPARISON:  02/08/2021 and CT chest abdomen pelvis 02/02/2021. FINDINGS: Mediastinal blood pool activity: SUV max 1.5 Liver activity: SUV max 2.8 NECK: No hypermetabolic lymph nodes. Incidental CT findings: None. CHEST: No abnormal hypermetabolism. Incidental CT findings: Atherosclerotic calcification of the aorta and aortic valve. Ascending aorta measures up to 4.4 cm, stable. No pericardial or pleural effusion. Moderate hiatal hernia. ABDOMEN/PELVIS: No abnormal hypermetabolism in the liver, adrenal glands, spleen or pancreas. No hypermetabolic lymph nodes. Incidental CT findings: Liver, gallbladder, adrenal glands, kidneys, spleen and pancreas are unremarkable. Moderate hiatal hernia. Duodenal diverticulum. Small bowel, appendix and colon are otherwise unremarkable. Atherosclerotic calcification of the aorta. SKELETON: An expansile lytic mass in the right sacrum extends into the right sciatic region with an SUV max 14.2 superiorly and 17.1 inferiorly. Overall size appears grossly stable in the very short interval from 02/08/2021. No additional abnormal osseous hypermetabolism. Incidental CT findings: Right hip arthroplasty.  Degenerative changes in the spine. IMPRESSION: 1. Hypermetabolic lytic right sacral mass with extension into the right sciatic region, stable in size from 02/08/2021 and compatible with the  provided history of lymphoma.  No evidence of distant disease. 2. Ascending aortic aneurysm as before. Recommend annual imaging followup by CTA or MRA. This recommendation follows 2010 ACCF/AHA/AATS/ACR/ASA/SCA/SCAI/SIR/STS/SVM Guidelines for the Diagnosis and Management of Patients with Thoracic Aortic Disease. Circulation. 2010; 121: [id number]. Aortic aneurysm NOS (ICD10-I71.9) 3.  Aortic atherosclerosis (ICD10-I70.0). Electronically Signed   By: Lorin Picket M.D.   On: 02/16/2021 15:39   NM PET Image Initial (PI) Skull Base To Thigh  Result Date: 02/09/2021 CLINICAL DATA:  Initial treatment strategy for large B-cell lymphoma. EXAM: NUCLEAR MEDICINE PET SKULL BASE TO THIGH TECHNIQUE: 9.3 mCi F-18 FDG was injected intravenously. Full-ring PET imaging was performed from the skull base to thigh after the radiotracer. CT data was obtained and used for attenuation correction and anatomic localization. Fasting blood glucose: 94 mg/dl COMPARISON:  Multiple exams, including CT scan 02/03/2020 FINDINGS: Mediastinal blood pool activity: SUV max 2.3 (taken from the abdominal aorta) Liver activity: SUV max 3.4 Unfortunately due to discomfort the patient terminated the PET portion of today's examination with acquisition of only the proximal thighs through the kidneys, but not including vertical levels above the kidneys such as the upper portion of the upper abdomen, chest, and neck. Accordingly we have CT data but no PET data for those regions. The image region does include the only area of known definite tumor based on prior imaging, which is in the right hemipelvis region. NECK: No PET data available Incidental CT findings: Bilateral common carotid atherosclerotic calcifications. CHEST: No PET data available Incidental CT findings: Coronary, aortic arch, and branch vessel atherosclerotic vascular disease. Prior CABG. 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 Moderate-sized hiatal hernia. Right axillary  clips noted. No pathologic adenopathy identified. Mild biapical pleuroparenchymal scarring. Mild scarring in the posterior basal segment left lower lobe. ABDOMEN/PELVIS: There is no PET data about the level of the kidneys, for example involving the spleen, and upper half of the liver. Right pelvic mass eroding the posterior margin of the right sacrum and medial margin of the right iliac bone noted in the right posterior paraspinal musculature, extending along the right S1 and S2 foramina and penetrating specially through the S2 foramen into the right presacral space, with some local mass effect on the adjacent sciatic nerve. The upper posterior paraspinal segment of this continuous mass has a maximum SUV of 20.2 (Deauville 5), and the presacral portion extending through the right sciatic notch along the piriformis muscle has maximum SUV of 22.4 (Deauville 5). Morphologically the mass appears similar to on recent CT examination of 02/02/2021, although the mass does not substantially cross midline and accordingly the measurement for the posterior paraspinal component is a little exaggerated on that exam. Currently the hypermetabolic activity associated with the posterior paraspinal component measures about 4.7 by 4.9 by 8.1 cm, and the oval-shaped component tracking along the piriformis muscle measures about 8.0 by 4.2 by 3.4 cm. No separate hypermetabolic lesion is identified. Incidental CT findings: Atherosclerosis is present, including aortoiliac atherosclerotic disease. Sigmoid colon diverticulosis. Borderline prostatomegaly. SKELETON: Bony erosions of the right posterior sacrum and medial right iliac bone due to the posterior paraspinal component of the pelvic mass identified. No other hypermetabolic activity is identified from the L1-2 level down. Level cephalad to the L1-2 level do not have associated PET data. Incidental CT findings: Degenerative glenohumeral arthropathy bilaterally. Lumbar spondylosis and  degenerative disc disease. IMPRESSION: 1. Unfortunately due to discomfort the patient terminated the PET portion of today's examination with acquisition of only the proximal thighs through the kidneys,  but not including vertical levels above the kidneys such as the upper portion of the upper abdomen, the chest, and the neck. There is no known involvement in the region omitted on the PET data based on prior imaging. 2. The known mass of the right posterior paraspinal musculature along the upper sacrum tracking through the S1 and S2 neural foramina into the right piriformis muscle and subsequently out through the sciatic notch is Deauville 5 with measurements as noted above. This erodes the posterior sacrum and part of the adjacent iliac bone. 3. 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 Recommend annual imaging followup by CTA or MRA. This recommendation follows 2010 ACCF/AHA/AATS/ACR/ASA/SCA/SCAI/SIR/STS/SVM Guidelines for the Diagnosis and Management of Patients with Thoracic Aortic Disease. Circulation. 2010; 121: V672-C947. Aortic aneurysm NOS (ICD10-I71.9) 4. Other imaging findings of potential clinical significance: Aortic Atherosclerosis (ICD10-I70.0). Coronary atherosclerosis with prior CABG. Moderate-sized hiatal hernia. Sigmoid colon diverticulosis. Borderline prostatomegaly. Lumbar spondylosis and degenerative disc disease. Electronically Signed   By: Van Clines M.D.   On: 02/09/2021 12:03   CT BIOPSY  Result Date: 01/24/2021 CLINICAL DATA:  Mass of the right posterior sacrum and paraspinous musculature. EXAM: CT GUIDED CORE BIOPSY OF RIGHT SACRAL/PARASPINOUS MASS ANESTHESIA/SEDATION: Moderate (conscious) sedation was employed during this procedure. A total of Versed 1.0 mg and Fentanyl 50 mcg was administered intravenously by radiology nursing. Moderate Sedation Time: 20 minutes. The patient's level of consciousness and vital signs were monitored continuously by radiology nursing  throughout the procedure under my direct supervision. PROCEDURE: The procedure risks, benefits, and alternatives were explained to the patient. Questions regarding the procedure were encouraged and answered. The patient understands and consents to the procedure. A time-out was performed prior to initiating the procedure. CT was performed through the sacrum and bony pelvis in a prone position. The right posterior pelvic region was prepped with chlorhexidine in a sterile fashion, and a sterile drape was applied covering the operative field. A sterile gown and sterile gloves were used for the procedure. Local anesthesia was provided with 1% Lidocaine. Under CT guidance, a 17 gauge trocar needle was advanced into the right posterior paraspinous region at the level of the upper sacrum. After confirming needle tip position, 4 separate coaxial 18 gauge core biopsy samples were obtained and submitted in formalin. Additional CT was then performed after outer needle removal. COMPLICATIONS: None FINDINGS: By CT, the right posterior paraspinous soft tissue mass that also involves the posterior sacrum and causes bony destruction measures roughly 5 cm in greatest transverse diameter. Solid tissue was obtained from the mass. IMPRESSION: CT-guided core biopsy performed of a right posterior paraspinous and sacral mass measuring roughly 5 cm in greatest transverse diameter. Electronically Signed   By: Aletta Edouard M.D.   On: 01/24/2021 12:41   DG Chest Portable 1 View  Result Date: 02/19/2021 CLINICAL DATA:  Evaluate for infiltrate. EXAM: PORTABLE CHEST 1 VIEW COMPARISON:  CT dated 02/02/2021. FINDINGS: Right-sided Port-A-Cath with tip over central SVC. Minimal bibasilar atelectasis. No focal consolidation, pleural effusion, pneumothorax. The cardiac silhouette is within limits. Atherosclerotic calcification of the aorta. Median sternotomy wires and CABG vascular clips. No acute osseous pathology. IMPRESSION: No active disease.  Electronically Signed   By: Anner Crete M.D.   On: 02/19/2021 21:43   CT BONE MARROW BIOPSY  Result Date: 02/09/2021 INDICATION: B-cell lymphoma EXAM: CT BIOPSY BONE MARROW MEDICATIONS: None. ANESTHESIA/SEDATION: Moderate (conscious) sedation was employed during this procedure. A total of Versed 1 mg and Fentanyl 25 mcg was administered  intravenously. Moderate Sedation Time: 15 minutes. The patient's level of consciousness and vital signs were monitored continuously by radiology nursing throughout the procedure under my direct supervision. FLUOROSCOPY TIME:  N/a COMPLICATIONS: None immediate. PROCEDURE: Informed written consent was obtained from the patient after a thorough discussion of the procedural risks, benefits and alternatives. All questions were addressed. Maximal Sterile Barrier Technique was utilized including caps, mask, sterile gowns, sterile gloves, sterile drape, hand hygiene and skin antiseptic. A timeout was performed prior to the initiation of the procedure. The patient was placed prone on the CT exam table. Limited CT of the pelvis was performed for planning purposes. Skin entry site was marked, and the overlying skin was prepped and draped in the standard sterile fashion. Local analgesia was obtained with 1% lidocaine. Using CT guidance, an 11 gauge needle was advanced just deep to the cortex of the right posterior ilium. Subsequently, bone marrow aspiration and core biopsy were performed. Specimens were submitted to lab/pathology for handling. Hemostasis was achieved with manual pressure, and a clean dressing was placed. The patient tolerated the procedure well without immediate complication. IMPRESSION: Successful CT-guided bone marrow aspiration and core biopsy of the right posterior ilium. Electronically Signed   By: Albin Felling M.D.   On: 02/09/2021 10:33   ECHOCARDIOGRAM COMPLETE  Result Date: 02/15/2021    ECHOCARDIOGRAM REPORT   Patient Name:   MARKCUS LAZENBY Date of Exam:  02/15/2021 Medical Rec #:  [id number]      Height:       69.0 in Accession #:    [id number]     Weight:       163.0 lb Date of Birth:  02/21/1945       BSA:          1.894 m Patient Age:    76 years       BP:           163/84 mmHg Patient Gender: M              HR:           82 bpm. Exam Location:  [facility] Procedure: 2D Echo, Cardiac Doppler and Color Doppler Indications:     Chemo Z09  History:         Patient has no prior history of Echocardiogram examinations.                  Previous Myocardial Infarction and CAD, Prior CABG; Risk                  Factors:Hypertension and Dyslipidemia.  Sonographer:     Sherrie Sport Referring Phys:  [id number] Lloyd Huger Diagnosing Phys: Nelva Bush MD  Sonographer Comments: Suboptimal apical window and suboptimal subcostal window. IMPRESSIONS  1. Left ventricular ejection fraction, by estimation, is 60 to 65%. The left ventricle has normal function. The left ventricle has no regional wall motion abnormalities. The left ventricular internal cavity size was mildly dilated. Left ventricular diastolic parameters are consistent with Grade I diastolic dysfunction (impaired relaxation).  2. Right ventricular systolic function is mildly reduced. The right ventricular size is normal. Mildly increased right ventricular wall thickness.  3. The mitral valve is grossly normal. No evidence of mitral valve regurgitation. No evidence of mitral stenosis.  4. The aortic valve is tricuspid. There is mild calcification of the aortic valve. There is mild thickening of the aortic valve. Aortic valve regurgitation is mild.  5. Aortic dilatation noted. There is mild dilatation  of the aortic root, measuring 41 mm. FINDINGS  Left Ventricle: Left ventricular ejection fraction, by estimation, is 60 to 65%. The left ventricle has normal function. The left ventricle has no regional wall motion abnormalities. The left ventricular internal cavity size was mildly dilated. There is  no left ventricular  hypertrophy. Abnormal (paradoxical) septal motion consistent with post-operative status. Left ventricular diastolic parameters are consistent with Grade I diastolic dysfunction (impaired relaxation). Right Ventricle: The right ventricular size is normal. Mildly increased right ventricular wall thickness. Right ventricular systolic function is mildly reduced. Left Atrium: Left atrial size was normal in size. Right Atrium: Right atrial size was normal in size. Pericardium: There is no evidence of pericardial effusion. Mitral Valve: The mitral valve is grossly normal. No evidence of mitral valve regurgitation. No evidence of mitral valve stenosis. MV peak gradient, 2.4 mmHg. The mean mitral valve gradient is 1.0 mmHg. Tricuspid Valve: The tricuspid valve is not well visualized. Tricuspid valve regurgitation is mild. Aortic Valve: The aortic valve is tricuspid. There is mild calcification of the aortic valve. There is mild thickening of the aortic valve. Aortic valve regurgitation is mild. Aortic valve mean gradient measures 2.0 mmHg. Aortic valve peak gradient measures 3.6 mmHg. Aortic valve area, by VTI measures 4.86 cm. Pulmonic Valve: The pulmonic valve was not well visualized. Pulmonic valve regurgitation is not visualized. No evidence of pulmonic stenosis. Aorta: Aortic dilatation noted. 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 Pulmonary Artery: The pulmonary artery is of normal size. IAS/Shunts: The interatrial septum was not well visualized.  LEFT VENTRICLE PLAX 2D LVIDd:         3.70 cm   Diastology LVIDs:         2.20 cm   LV e' medial:    5.77 cm/s LV PW:         1.30 cm   LV E/e' medial:  8.7 LV IVS:        1.00 cm   LV e' lateral:   14.30 cm/s LVOT diam:     2.20 cm   LV E/e' lateral: 3.5 LV SV:         69 LV SV Index:   36 LVOT Area:     3.80 cm  RIGHT VENTRICLE RV Basal diam:  4.00 cm RV S prime:     9.79 cm/s TAPSE (M-mode): 1.2 cm LEFT ATRIUM             Index        RIGHT ATRIUM            Index LA diam:        1.90 cm 1.00 cm/m   RA Area:     12.90 cm LA Vol (A2C):   29.4 ml 15.52 ml/m  RA Volume:   30.70 ml  16.21 ml/m LA Vol (A4C):   15.3 ml 8.08 ml/m LA Biplane Vol: 22.2 ml 11.72 ml/m  AORTIC VALVE                    PULMONIC VALVE AV Area (Vmax):    3.75 cm     PV Vmax:        0.67 m/s AV Area (Vmean):   4.06 cm     PV Vmean:       45.400 cm/s AV Area (VTI):     4.86 cm     PV VTI:         0.097 m AV Vmax:  95.40 cm/s   PV Peak grad:   1.8 mmHg AV Vmean:          65.700 cm/s  PV Mean grad:   1.0 mmHg AV VTI:            0.142 m      RVOT Peak grad: 2 mmHg AV Peak Grad:      3.6 mmHg AV Mean Grad:      2.0 mmHg LVOT Vmax:         94.00 cm/s LVOT Vmean:        70.200 cm/s LVOT VTI:          0.181 m LVOT/AV VTI ratio: 1.28  AORTA Ao Root diam: 4.10 cm MITRAL VALVE               TRICUSPID VALVE MV Area (PHT): 2.43 cm    TR Peak grad:   14.6 mmHg MV Area VTI:   3.98 cm    TR Vmax:        191.00 cm/s MV Peak grad:  2.4 mmHg MV Mean grad:  1.0 mmHg    SHUNTS MV Vmax:       0.78 m/s    Systemic VTI:  0.18 m MV Vmean:      40.9 cm/s   Systemic Diam: 2.20 cm MV Decel Time: 312 msec    Pulmonic VTI:  0.081 m MV E velocity: 50.10 cm/s MV A velocity: 56.10 cm/s MV E/A ratio:  0.89 Christopher End MD Electronically signed by Nelva Bush MD Signature Date/Time: 02/15/2021/9:47:17 AM    Final    IR IMAGING GUIDED PORT INSERTION  Result Date: 02/11/2021 INDICATION: b cell lymphoma EXAM: 1. Ultrasound-guided puncture of the right internal jugular vein 2. Placement of a right-sided chest port using fluoroscopic guidance MEDICATIONS: None ANESTHESIA/SEDATION: Moderate (conscious) sedation was employed during this procedure. A total of Versed 2 mg and Fentanyl 100 mcg was administered intravenously. Moderate Sedation Time: 26 minutes. The patient's level of consciousness and vital signs were monitored continuously by radiology nursing throughout the procedure under my direct  supervision. FLUOROSCOPY TIME:  Fluoroscopy Time: 0.7 minutes with 3 exposures COMPLICATIONS: None immediate. PROCEDURE: Informed written consent was obtained from the patient after a thorough discussion of the procedural risks, benefits and alternatives. All questions were addressed. Maximal Sterile Barrier Technique was utilized including caps, mask, sterile gowns, sterile gloves, sterile drape, hand hygiene and skin antiseptic. A timeout was performed prior to the initiation of the procedure. The patient was placed supine on the exam table. The right neck and chest was prepped and draped in the standard sterile fashion. A preliminary ultrasound of the right neck was performed and demonstrates a patent right internal jugular vein. A permanent ultrasound image was stored in the electronic medical record. The overlying skin was anesthetized with 1% Lidocaine. Using ultrasound guidance, access was obtained into the right internal jugular vein using a 21 gauge micropuncture set. A wire was advanced into the SVC, a short incision was made at the puncture site, and serial dilatation performed. Next, in an ipsilateral infraclavicular location, an incision was made at the site of the subcutaneous reservoir. Blunt dissection was used to open a pocket to contain the reservoir. A subcutaneous tunnel was then created from the port site to the puncture site. A(n) 8 Fr single lumen catheter was advanced through the tunnel. The catheter was attached to the port and this was placed in the subcutaneous pocket. Under fluoroscopic guidance, a peel away sheath  was placed, and the catheter was trimmed to the appropriate length and was advanced into the central veins. The catheter length is 26 cm. The tip of the catheter lies near the superior cavoatrial junction. The port flushes and aspirates appropriately. The port was flushed and locked with heparinized saline. The port pocket was closed in 2 layers using 3-0 and 4-0  Vicryl/absorbable suture. Dermabond was also applied to both incisions. The patient tolerated the procedure well and was transferred to recovery in stable condition. IMPRESSION: Successful placement of a right chest port via the right internal jugular vein. The port is ready for immediate use. Electronically Signed   By: Albin Felling M.D.   On: 02/11/2021 12:02   DG FLUORO GUIDED LOC OF NEEDLE/CATH TIP FOR SPINAL INJECT LT  Result Date: 02/04/2021 CLINICAL DATA:  Patient with B-cell lymphoma request received for diagnostic lumbar puncture. EXAM: DIAGNOSTIC LUMBAR PUNCTURE UNDER FLUOROSCOPIC GUIDANCE COMPARISON:  MR lumbar spine performed 01/12/2021 reviewed prior to the procedure. FLUOROSCOPY TIME:  Fluoroscopy Time: 0.8 Minute Radiation Exposure Index (if provided by the fluoroscopic device): 17.80 mGy Number of Acquired Spot Images: 3 PROCEDURE: Informed consent was obtained from the patient/patient's legal guardian prior to the procedure, including potential complications of bleeding, infection, paresthesias, nerve damage, CSF leak requiring additional procedures, post procedure requirement to lay flat for several hours after the procedure, headache, allergy, and pain. With the patient prone, the lower back was prepped with Betadine. 1% Lidocaine was used for local anesthesia. Attempted lumbar puncture was performed at the L2-L3 however unable to get fluid back, inner stylet of the needle was replaced and the needle was removed in it's entirety. Lumbar puncture was then performed at the L1-L2 level using a 20 gauge needle with return of slight blood-tinged at first which turned to colorless CSF. 15 ml of CSF were obtained for laboratory studies. The inner stylet was placed back into the needle and the needle was removed in its entirety. The patient tolerated the procedure well and there were no apparent complications. A sterile bandage was applied. IMPRESSION: Technically successful fluoroscopic guided  lumbar puncture. This exam was performed by Tsosie Billing PA-C, and was supervised and interpreted by Dr. Jearld Lesch. Electronically Signed   By: Kathreen Devoid M.D.   On: 02/04/2021 11:18    Microbiology: Recent Results (from the past 240 hour(s))  Resp Panel by RT-PCR (Flu A&B, Covid) Nasopharyngeal Swab     Status: None   Collection Time: 02/19/21  3:51 PM   Specimen: Nasopharyngeal Swab; Nasopharyngeal(NP) swabs in vial transport medium  Result Value Ref Range Status   SARS Coronavirus 2 by RT PCR NEGATIVE NEGATIVE Final    Comment: (NOTE) SARS-CoV-2 target nucleic acids are NOT DETECTED.  The SARS-CoV-2 RNA is generally detectable in upper respiratory specimens during the acute phase of infection. The lowest concentration of SARS-CoV-2 viral copies this assay can detect is 138 copies/mL. A negative result does not preclude SARS-Cov-2 infection and should not be used as the sole basis for treatment or other patient management decisions. A negative result may occur with  improper specimen collection/handling, submission of specimen other than nasopharyngeal swab, presence of viral mutation(s) within the areas targeted by this assay, and inadequate number of viral copies(<138 copies/mL). A negative result must be combined with clinical observations, patient history, and epidemiological information. The expected result is Negative.  Fact Sheet for Patients:  EntrepreneurPulse.com.au  Fact Sheet for Healthcare Providers:  IncredibleEmployment.be  This test is no t yet approved  or cleared by the [place] and  has been authorized for detection and/or diagnosis of SARS-CoV-2 by FDA under an Emergency Use Authorization (EUA). This EUA will remain  in effect (meaning this test can be used) for the duration of the COVID-19 declaration under Section 564(b)(1) of the Act, 21 U.S.C.section 360bbb-3(b)(1), unless the authorization is terminated  or  revoked sooner.       Influenza A by PCR NEGATIVE NEGATIVE Final   Influenza B by PCR NEGATIVE NEGATIVE Final    Comment: (NOTE) The Xpert Xpress SARS-CoV-2/FLU/RSV plus assay is intended as an aid in the diagnosis of influenza from Nasopharyngeal swab specimens and should not be used as a sole basis for treatment. Nasal washings and aspirates are unacceptable for Xpert Xpress SARS-CoV-2/FLU/RSV testing.  Fact Sheet for Patients: EntrepreneurPulse.com.au  Fact Sheet for Healthcare Providers: IncredibleEmployment.be  This test is not yet approved or cleared by the [place] FDA and has been authorized for detection and/or diagnosis of SARS-CoV-2 by FDA under an Emergency Use Authorization (EUA). This EUA will remain in effect (meaning this test can be used) for the duration of the COVID-19 declaration under Section 564(b)(1) of the Act, 21 U.S.C. section 360bbb-3(b)(1), unless the authorization is terminated or revoked.  Performed at [facility], [address] [place], [place] [address]   Culture, blood (routine x 2)     Status: None (Preliminary result)   Collection Time: 02/19/21  4:04 PM   Specimen: BLOOD  Result Value Ref Range Status   Specimen Description BLOOD LEFT ANTECUBITAL  Final   Special Requests   Final    BOTTLES DRAWN AEROBIC AND ANAEROBIC Blood Culture adequate volume   Culture   Final    NO GROWTH < 24 HOURS Performed at [facility], [address], [place], [place] [address]    Report Status PENDING  Incomplete  Culture, blood (routine x 2)     Status: None (Preliminary result)   Collection Time: 02/19/21  4:05 PM   Specimen: BLOOD  Result Value Ref Range Status   Specimen Description BLOOD BLOOD LEFT HAND  Final   Special Requests   Final    BOTTLES DRAWN AEROBIC AND ANAEROBIC Blood Culture adequate volume   Culture   Final    NO GROWTH < 24 HOURS Performed at [facility],  [address], [place], [place] [address]    Report Status PENDING  Incomplete  C Difficile Quick Screen w PCR reflex     Status: None   Collection Time: 02/19/21  8:38 PM   Specimen: STOOL  Result Value Ref Range Status   C Diff antigen NEGATIVE NEGATIVE Final   C Diff toxin NEGATIVE NEGATIVE Final   C Diff interpretation No C. difficile detected.  Final    Comment: Performed at [facility], [address] [place], [place] [address]  Gastrointestinal Panel by PCR , Stool     Status: None   Collection Time: 02/19/21  8:38 PM   Specimen: STOOL  Result Value Ref Range Status   Campylobacter species NOT DETECTED NOT DETECTED Final   Plesimonas shigelloides NOT DETECTED NOT DETECTED Final   Salmonella species NOT DETECTED NOT DETECTED Final   Yersinia enterocolitica NOT DETECTED NOT DETECTED Final   Vibrio species NOT DETECTED NOT DETECTED Final   Vibrio cholerae NOT DETECTED NOT DETECTED Final   Enteroaggregative E coli (EAEC) NOT DETECTED NOT DETECTED Final   Enteropathogenic E coli (EPEC) NOT DETECTED NOT DETECTED  Final   Enterotoxigenic E coli (ETEC) NOT DETECTED NOT DETECTED Final   Shiga like toxin producing E coli (STEC) NOT DETECTED NOT DETECTED Final   Shigella/Enteroinvasive E coli (EIEC) NOT DETECTED NOT DETECTED Final   Cryptosporidium NOT DETECTED NOT DETECTED Final   Cyclospora cayetanensis NOT DETECTED NOT DETECTED Final   Entamoeba histolytica NOT DETECTED NOT DETECTED Final   Giardia lamblia NOT DETECTED NOT DETECTED Final   Adenovirus F40/41 NOT DETECTED NOT DETECTED Final   Astrovirus NOT DETECTED NOT DETECTED Final   Norovirus GI/GII NOT DETECTED NOT DETECTED Final   Rotavirus A NOT DETECTED NOT DETECTED Final   Sapovirus (I, II, IV, and V) NOT DETECTED NOT DETECTED Final    Comment: Performed at [facility], [address] [place], [place] [address]     Labs: Basic Metabolic Panel: Recent Labs  Lab 02/19/21 1604 02/20/21 0713  NA  135  --   K 3.6  --   CL 102  --   CO2 22  --   GLUCOSE 154*  --   BUN 17  --   CREATININE 0.95 0.84  CALCIUM 10.4*  --    Liver Function Tests: Recent Labs  Lab 02/19/21 1604  AST 26  ALT 17  ALKPHOS 66  BILITOT 1.0  PROT 8.0  ALBUMIN 4.3   Recent Labs  Lab 02/19/21 1604  LIPASE 27   No results for input(s): AMMONIA in the last 168 hours. CBC: Recent Labs  Lab 02/19/21 1604 02/20/21 0713  WBC 15.3* 14.2*  NEUTROABS 9.6*  --   HGB 16.6 14.3  HCT 45.2 39.9  MCV 90.4 90.1  PLT 412* 361   Cardiac Enzymes: No results for input(s): CKTOTAL, CKMB, CKMBINDEX, TROPONINI in the last 168 hours. BNP: BNP (last 3 results) No results for input(s): BNP in the last 8760 hours.  ProBNP (last 3 results) No results for input(s): PROBNP in the last 8760 hours.  CBG: Recent Labs  Lab 02/16/21 0827  GLUCAP 139*       Signed:  Oswald Hillock MD.  [facility] 02/20/2021, 1:30 PM

## 2021-02-20 NOTE — ED Notes (Signed)
Phlebotomy at bedside.

## 2021-02-20 NOTE — ED Notes (Addendum)
Pt had episode of bowel incontinence in bed. Pt is changed and adjusted in bed. Pt denies any other needs at this time.

## 2021-02-20 NOTE — ED Notes (Addendum)
Pt helped to use urinal

## 2021-02-20 NOTE — Care Management Obs Status (Signed)
MEDICARE OBSERVATION STATUS NOTIFICATION   Patient Details  Name: Jordan Mason MRN: [id number] Date of Birth: 07-06-1945   Medicare Observation Status Notification Given:  Yes    [place], [place] 02/20/2021, 11:02 AM

## 2021-02-20 NOTE — ED Notes (Signed)
Per pt and tech, Jordan Mason, pt had a large BM and pt states it was normal and formed and also states relief in ABD pain after BM.

## 2021-02-20 NOTE — Care Management CC44 (Signed)
Condition Code 44 Documentation Completed  Patient Details  Name: Jordan Mason MRN: [id number] Date of Birth: [birth year]   Condition Code 44 given:  Yes Patient signature on Condition Code 44 notice:  Yes Documentation of 2 MD's agreement:  Yes Code 44 added to claim:  Yes    [place], [place] 02/20/2021, 11:02 AM

## 2021-02-20 NOTE — ED Notes (Signed)
Provider at bedside

## 2021-02-20 NOTE — ED Notes (Signed)
Mickel Baas (pts wife) : [phone] Call with any questions

## 2021-02-20 NOTE — ED Notes (Signed)
This RN went to check on pt in room. When entering room this RN sees pt standing up covered in feces and blood. Pt states he had to have a bowel movement and next thing he know he was up and out of bed. Pt is assisted back to bed. Pt is cleaned up and placed into clean gown. Pt is connected back up to all monitors. Pt is placed on posey alarm. Pt is educated on importance of staying in bed and use of cal bed. Pt understands at this time.

## 2021-02-22 ENCOUNTER — Other Ambulatory Visit: Payer: Self-pay

## 2021-02-22 ENCOUNTER — Inpatient Hospital Stay: Payer: Medicare Other | Attending: Oncology | Admitting: Oncology

## 2021-02-22 VITALS — BP 141/103 | HR 99 | Resp 18 | Wt 157.0 lb

## 2021-02-22 DIAGNOSIS — D701 Agranulocytosis secondary to cancer chemotherapy: Secondary | ICD-10-CM | POA: Insufficient documentation

## 2021-02-22 DIAGNOSIS — Z5112 Encounter for antineoplastic immunotherapy: Secondary | ICD-10-CM | POA: Insufficient documentation

## 2021-02-22 DIAGNOSIS — T7840XA Allergy, unspecified, initial encounter: Secondary | ICD-10-CM | POA: Insufficient documentation

## 2021-02-22 DIAGNOSIS — Z5111 Encounter for antineoplastic chemotherapy: Secondary | ICD-10-CM | POA: Diagnosis present

## 2021-02-22 DIAGNOSIS — D75839 Thrombocytosis, unspecified: Secondary | ICD-10-CM | POA: Diagnosis not present

## 2021-02-22 DIAGNOSIS — C8510 Unspecified B-cell lymphoma, unspecified site: Secondary | ICD-10-CM

## 2021-02-22 DIAGNOSIS — D649 Anemia, unspecified: Secondary | ICD-10-CM | POA: Insufficient documentation

## 2021-02-22 DIAGNOSIS — C8339 Diffuse large B-cell lymphoma, extranodal and solid organ sites: Secondary | ICD-10-CM | POA: Diagnosis present

## 2021-02-22 MED ORDER — DEXAMETHASONE 4 MG PO TABS: 4.0000 mg | ORAL_TABLET | Freq: Every day | ORAL | 3 refills | Status: AC

## 2021-02-22 NOTE — Progress Notes (Signed)
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  INTERVAL HISTORY: Patient returns to clinic today for further evaluation and discussion of his imaging and pathology results, and treatment planning.  He continues to have significant pain, but otherwise feels well. He denies any night sweats, fevers, or weight loss.  He has no neurologic complaints.  He has no chest pain, shortness of breath, cough, or hemoptysis.  He denies any nausea, vomiting, constipation, or diarrhea.  He has no urinary complaints.  Patient offers no further specific complaints today.  REVIEW OF SYSTEMS:   Review of Systems  Constitutional: Negative.  Negative for fever, malaise/fatigue and weight loss.  Respiratory: Negative.  Negative for cough, hemoptysis and shortness of breath.   Cardiovascular: Negative.  Negative for chest pain and leg swelling.  Gastrointestinal: Negative.  Negative for abdominal pain.  Genitourinary: Negative.  Negative for dysuria.  Musculoskeletal:  Positive for back pain.  Skin: Negative.  Negative for rash.  Neurological: Negative.  Negative for dizziness, focal weakness, weakness and headaches.  Psychiatric/Behavioral: Negative.  The patient is not nervous/anxious.    As per HPI. Otherwise, a complete review of systems is negative.  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   a.)  s/p RIGHT sided PTA and stenting    MDD (major depressive disorder)    Myocardial infarction ([place]) 02/21/2006   Neuropathy    OSA (obstructive sleep apnea)    Peripheral vascular disease (HCC)    Postlaminectomy syndrome of lumbar region 09/21/2011   Postoperative atrial fibrillation (HCC)    Statin intolerance    Thoracic aortic aneurysm    s/p dissection and repair in 08/2012    PAST SURGICAL HISTORY: Past Surgical History:  Procedure Laterality Date   ANGIOPLASTY / STENTING ILIAC Right 04/03/2006   Procedure(s): abdominal aortography, bilateral lower extremity runoff, iliac PTA and stenting; Location: [facility]; Surgeon: Dionne Ano, MD   BLEPHAROPLASTY Bilateral 05/06/2019   Procedure: BLEPHAROPLASTY, UPPER EYELID; WITH EXCESSIVE SKIN WEIGHTING DOWN LID; Surgeon: Lucilla Lame, MD; Location: [facility]; Service: Ophthalmology; Laterality: Bilateral   CARDIAC CATHETERIZATION N/A 03/15/2006   Location: [facility]; Surgeon: Samella Parr, MD   CARDIAC CATHETERIZATION N/A 03/23/2011   Location: [facility]; Surgeon: Kathe Mariner, MD   CATARACT EXTRACTION W/ INTRAOCULAR LENS IMPLANT Right 03/04/2019   Procedure: EXTRACAPSULAR CATARACT PHACO REMOVAL WITH INSERTION OF INTRAOCULAR LENS PROSTHESIS (1 STAGE PROCEDURE), MANUAL OR MECHANICAL TECHNIQUE; Surgeon: Christean Grief, MD; Location: [facility]; Service: Ophthalmology; Laterality: Right; incision approximated   CATARACT EXTRACTION W/ INTRAOCULAR LENS IMPLANT Left 02/18/2019   Procedure: DEXTENZA- EXTRACAPSULAR CATARACT ECCE REMOVAL WITH INSERTION OF INTRAOCULAR LENS PROSTHESIS (1 STAGE PROCEDURE), MANUAL OR MECHANICAL TECHNIQUE; Surgeon: Christean Grief, MD; Location: [facility]; Service: Ophthalmology; Laterality: Left   COLONOSCOPY     CORONARY ARTERY BYPASS GRAFT N/A 03/29/2011   4v; LIMA-LAD,  SVG-RI, SVG-D1, SVG-RCA   ELECTROCONVULSIVE THERAPY N/A    Multiple treatments   FRACTURE SURGERY     IR IMAGING GUIDED PORT INSERTION  02/11/2021   LUMBAR LAMINECTOMY N/A 02/2004    L4-L5 and L5-S1   PERCUTANEOUS CORONARY STENT INTERVENTION (PCI-S) N/A 02/21/2006   Procedure: PCI with 3.5 x 16 mm Liberte BMS x 1 to RCA; Location: [facility]; Surgeon: Dionne Ano, MD   PERCUTANEOUS CORONARY STENT INTERVENTION (PCI-S) N/A 09/18/2006   Procedure: overlapping 3.5 x 18 mm Xience DES to in stent of BMS in RCA; Location: [facility]; Surgeon: Lovena Neighbours, MD   REPAIR OF ACUTE ASCENDING THORACIC AORTIC DISSECTION N/A 08/14/2012   Redo sternotomy for repair Type A dissection with ascending aortic graft, aortic valve resuspension, aortic valve central plication of non-coronary cusp, STJ remodeling; Location: [facility]; Surgeon: Dr. Cheree Ditto   REVISION TOTAL KNEE ARTHROPLASTY Right 11/2009   TONSILLECTOMY     TOTAL HIP ARTHROPLASTY Right 08/24/2020   Procedure: TOTAL HIP ARTHROPLASTY ANTERIOR APPROACH;  Surgeon: Hessie Knows, MD;  Location: [facility];  Service: Orthopedics;  Laterality: Right;   TOTAL KNEE ARTHROPLASTY Right 02/2008    FAMILY HISTORY: No family history on file.  ADVANCED DIRECTIVES (Y/N):  N  HEALTH MAINTENANCE: Social History   Tobacco Use   Smoking status: Former    Types: Cigars    Quit date: 03/19/2011    Years since quitting: 9.9   Smokeless tobacco: Never  Vaping Use   Vaping Use: Never used  Substance Use Topics   Alcohol use: Not Currently   Drug use: Never     Colonoscopy:  PAP:  Bone density:  Lipid panel:  Allergies  Allergen Reactions   Vortioxetine Other (See Comments)    Serotonin syndrome     Atorvastatin Other (See Comments)     Muscle Pain   Bupropion Other (See Comments)    vision problem     Morphine Nausea Only and Other (See Comments)    "dreams"    Pravastatin Other (See Comments)     Muscle Pain    Rosuvastatin Other (See Comments)    Muscle Pain   Felodipine Other (See Comments)     sexual dysfunction     Hydrochlorothiazide W-Triamterene Other (See Comments)    leg cramps     Lisinopril Other (See Comments)      jitteriness     Metoprolol Other (See Comments)    sexual dys, abn tongue sens.      Current Outpatient Medications  Medication Sig Dispense Refill   amphetamine-dextroamphetamine (ADDERALL) 20 MG tablet Take 20 mg by mouth 2 (two) times daily.     benazepril (LOTENSIN) 20 MG tablet Take 20 mg by mouth at bedtime.     celecoxib (CELEBREX) 200 MG capsule Take 1 capsule by mouth 2 (two) times daily.     cetirizine (ZYRTEC) 10 MG tablet Take 10 mg by mouth daily as needed for allergies.     Cholecalciferol (VITAMIN D3 SUPER STRENGTH) 50 MCG (2000 UT) TABS Take 2,000 Units by mouth daily.     dexamethasone (DECADRON) 4 MG tablet Take 1 tablet (4 mg total) by mouth daily. 30 tablet 3   escitalopram (LEXAPRO) 20 MG tablet Take 20 mg by mouth at bedtime.     fluticasone (FLONASE) 50 MCG/ACT nasal spray Place 2 sprays into the nose daily.     HYDROcodone-acetaminophen (NORCO/VICODIN) 5-325 MG tablet Take 1-2 tablets by mouth every 6 (six) hours as needed for moderate pain (pain score 4-6).  20 tablet 0   HYDROcodone-acetaminophen (NORCO/VICODIN) 5-325 MG tablet Take 1 tablet by mouth once for 1 dose. 30 tablet 0   ibuprofen (ADVIL) 200 MG tablet Take 800 mg by mouth daily as needed.     LATUDA 40 MG TABS tablet Take 40 mg by mouth at bedtime.     Multiple Vitamins-Minerals (MULTIVITAMIN WITH MINERALS) tablet Take 1 tablet by mouth daily.     pantoprazole (PROTONIX) 40 MG tablet Take 40 mg by mouth daily.     polyethylene glycol (MIRALAX / GLYCOLAX) 17 g packet Take 17 g by mouth daily. 14 each 1   SYMBICORT 80-4.5 MCG/ACT inhaler Inhale 2 puffs into the lungs 2 (two) times daily.     testosterone cypionate (DEPOTESTOSTERONE CYPIONATE) 200 MG/ML injection Inject 200 mg into the muscle every 14 (fourteen) days.     TIADYLT ER 360 MG 24 hr capsule Take 360 mg by mouth at bedtime.     VASCEPA 1 g capsule Take 2 g by mouth 2 (two) times daily.     AUSTEDO 9 MG TABS Take 9 mg by mouth 2 (two) times  daily. (Patient not taking: Reported on 02/20/2021)     traMADol (ULTRAM) 50 MG tablet Take 1 tablet (50 mg total) by mouth every 6 (six) hours. (Patient not taking: Reported on 02/22/2021) 30 tablet 0   No current facility-administered medications for this visit.    OBJECTIVE: Vitals:   02/22/21 1426  BP: (!) 141/103  Pulse: 99  Resp: 18  SpO2: 100%     Body mass index is 23.18 kg/m.    ECOG FS:0 - Asymptomatic  General: Well-developed, well-nourished, no acute distress. Eyes: Pink conjunctiva, anicteric sclera. HEENT: Normocephalic, moist mucous membranes. Lungs: No audible wheezing or coughing. Heart: Regular rate and rhythm. Abdomen: Soft, nontender, no obvious distention. Musculoskeletal: No edema, cyanosis, or clubbing. Neuro: Alert, answering all questions appropriately. Cranial nerves grossly intact. Skin: No rashes or petechiae noted. Psych: Normal affect.   LAB RESULTS:  Lab Results  Component Value Date   NA 135 02/19/2021   K 3.6 02/19/2021   CL 102 02/19/2021   CO2 22 02/19/2021   GLUCOSE 154 (H) 02/19/2021   BUN 17 02/19/2021   CREATININE 0.84 02/20/2021   CALCIUM 10.4 (H) 02/19/2021   PROT 8.0 02/19/2021   ALBUMIN 4.3 02/19/2021   AST 26 02/19/2021   ALT 17 02/19/2021   ALKPHOS 66 02/19/2021   BILITOT 1.0 02/19/2021   GFRNONAA >60 02/20/2021    Lab Results  Component Value Date   WBC 14.2 (H) 02/20/2021   NEUTROABS 9.6 (H) 02/19/2021   HGB 14.3 02/20/2021   HCT 39.9 02/20/2021   MCV 90.1 02/20/2021   PLT 361 02/20/2021     STUDIES: MR CERVICAL SPINE W WO CONTRAST  Result Date: 01/28/2021 CLINICAL DATA:  Right hip pain, history of cancer EXAM: MRI CERVICAL AND THORACIC SPINE WITHOUT AND WITH CONTRAST TECHNIQUE: Multiplanar and multiecho pulse sequences of the cervical spine, to include the craniocervical junction and cervicothoracic junction, and the thoracic spine, were obtained without and with intravenous contrast. CONTRAST:  37m GADAVIST  GADOBUTROL 1 MMOL/ML IV SOLN COMPARISON:  No prior cervical or thoracic imaging. FINDINGS: Evaluation is somewhat limited by motion artifact. MRI CERVICAL SPINE FINDINGS Alignment: Trace retrolisthesis C3 on C4. Vertebrae: No acute fracture or suspicious osseous lesion. T1 and T2 hyperintense foci, most prominently in the C7 vertebral body, consistent with benign hemangiomas. No abnormal vertebral body enhancement. Cord: Normal signal and morphology.  No abnormal enhancement. Posterior Fossa, vertebral arteries, paraspinal tissues: Negative. Disc levels: C2-C3: Mild disc bulge. No spinal canal stenosis. Facet arthropathy. Mild bilateral neural foraminal narrowing. C3-C4: Disc bulge with superimposed central protrusion, which abuts the ventral cord. Moderate spinal canal stenosis. Uncovertebral and facet arthropathy. Moderate bilateral neural foraminal narrowing. C4-C5: Osseous fusion across the disc space, with central osseous protrusion, which abuts the ventral cord. Mild spinal canal stenosis. Uncovertebral and facet arthropathy. Mild bilateral neural foraminal narrowing. C5-C6: Moderate disc bulge. Moderate spinal canal stenosis. Uncovertebral and facet arthropathy. Moderate bilateral neural foraminal narrowing. C6-C7: Broad-based disc bulge. Mild spinal canal stenosis. Uncovertebral and facet arthropathy. Moderate to severe bilateral neural foraminal narrowing. C7-T1: No significant disc bulge. No spinal canal stenosis or neuroforaminal narrowing. MRI THORACIC SPINE FINDINGS Alignment:  No significant listhesis. Vertebrae: No acute fracture. Additional T1 and T2 hyperintense foci in the vertebral bodies, consistent with benign hemangiomas. Osseous fusion of T8 and T9, likely degenerative. No abnormal enhancement. Cord:  Normal signal and morphology.  No abnormal enhancement. Paraspinal and other soft tissues: Negative. Disc levels: Multilevel degenerative changes, with small disc bulges at T2-T3, T5-T6, and  T11-T12, but without significant spinal canal stenosis. Moderate bilateral neural foraminal narrowing at T2-T3 IMPRESSION: 1. No evidence of metastatic disease in the cervical or thoracic spine. 2. C3-C4 and C5-C6 moderate spinal canal stenosis and moderate bilateral neural foraminal narrowing. 3. C6-C7 mild spinal canal stenosis and moderate to severe bilateral neural foraminal narrowing. 4. C4-C5 mild spinal canal stenosis and mild bilateral neural foraminal narrowing. 5. No spinal canal stenosis in the thoracic spine. T2-T3 moderate bilateral neural foraminal narrowing. Electronically Signed   By: Merilyn Baba M.D.   On: 01/28/2021 19:54   MR THORACIC SPINE W WO CONTRAST  Result Date: 01/28/2021 CLINICAL DATA:  Right hip pain, history of cancer EXAM: MRI CERVICAL AND THORACIC SPINE WITHOUT AND WITH CONTRAST TECHNIQUE: Multiplanar and multiecho pulse sequences of the cervical spine, to include the craniocervical junction and cervicothoracic junction, and the thoracic spine, were obtained without and with intravenous contrast. CONTRAST:  19m GADAVIST GADOBUTROL 1 MMOL/ML IV SOLN COMPARISON:  No prior cervical or thoracic imaging. FINDINGS: Evaluation is somewhat limited by motion artifact. MRI CERVICAL SPINE FINDINGS Alignment: Trace retrolisthesis C3 on C4. Vertebrae: No acute fracture or suspicious osseous lesion. T1 and T2 hyperintense foci, most prominently in the C7 vertebral body, consistent with benign hemangiomas. No abnormal vertebral body enhancement. Cord: Normal signal and morphology.  No abnormal enhancement. Posterior Fossa, vertebral arteries, paraspinal tissues: Negative. Disc levels: C2-C3: Mild disc bulge. No spinal canal stenosis. Facet arthropathy. Mild bilateral neural foraminal narrowing. C3-C4: Disc bulge with superimposed central protrusion, which abuts the ventral cord. Moderate spinal canal stenosis. Uncovertebral and facet arthropathy. Moderate bilateral neural foraminal narrowing.  C4-C5: Osseous fusion across the disc space, with central osseous protrusion, which abuts the ventral cord. Mild spinal canal stenosis. Uncovertebral and facet arthropathy. Mild bilateral neural foraminal narrowing. C5-C6: Moderate disc bulge. Moderate spinal canal stenosis. Uncovertebral and facet arthropathy. Moderate bilateral neural foraminal narrowing. C6-C7: Broad-based disc bulge. Mild spinal canal stenosis. Uncovertebral and facet arthropathy. Moderate to severe bilateral neural foraminal narrowing. C7-T1: No significant disc bulge. No spinal canal stenosis or neuroforaminal narrowing. MRI THORACIC SPINE FINDINGS Alignment:  No significant listhesis. Vertebrae: No acute fracture. Additional T1 and T2 hyperintense foci in the vertebral bodies, consistent with benign hemangiomas. Osseous fusion of T8 and T9, likely degenerative. No abnormal enhancement. Cord:  Normal signal and morphology.  No abnormal  enhancement. Paraspinal and other soft tissues: Negative. Disc levels: Multilevel degenerative changes, with small disc bulges at T2-T3, T5-T6, and T11-T12, but without significant spinal canal stenosis. Moderate bilateral neural foraminal narrowing at T2-T3 IMPRESSION: 1. No evidence of metastatic disease in the cervical or thoracic spine. 2. C3-C4 and C5-C6 moderate spinal canal stenosis and moderate bilateral neural foraminal narrowing. 3. C6-C7 mild spinal canal stenosis and moderate to severe bilateral neural foraminal narrowing. 4. C4-C5 mild spinal canal stenosis and mild bilateral neural foraminal narrowing. 5. No spinal canal stenosis in the thoracic spine. T2-T3 moderate bilateral neural foraminal narrowing. Electronically Signed   By: Merilyn Baba M.D.   On: 01/28/2021 19:54   CT ABDOMEN PELVIS W CONTRAST  Result Date: 02/19/2021 CLINICAL DATA:  Nausea and vomiting. Diarrhea and decreased appetite. EXAM: CT ABDOMEN AND PELVIS WITH CONTRAST TECHNIQUE: Multidetector CT imaging of the abdomen and  pelvis was performed using the standard protocol following bolus administration of intravenous contrast. CONTRAST:  158m OMNIPAQUE IOHEXOL 300 MG/ML  SOLN COMPARISON:  PET CT 3 days ago.  Chest abdomen pelvis CT 02/02/2021 FINDINGS: Lower chest: Subsegmental atelectasis or scar in the right lower lobe. No pleural effusion or acute airspace disease. Unchanged hiatal hernia. Hepatobiliary: Focal fatty infiltration adjacent to the falciform ligament. No focal liver lesion. Mild gallbladder distention without calcified gallstone or pericholecystic fat stranding. No biliary dilatation. Pancreas: No ductal dilatation or inflammation. Spleen: Normal in size without focal abnormality. Adrenals/Urinary Tract: Normal adrenal glands. Lobulated bilateral renal contours without hydronephrosis. No perinephric edema. Homogeneous renal enhancement with symmetric excretion on delayed phase imaging. No focal renal abnormality or visualized stone. Unremarkable urinary bladder. Stomach/Bowel: Moderate hiatal hernia. Small duodenal diverticulum. No small bowel obstruction or abnormal distention. Occasional fluid-filled distal small bowel without perienteric fat stranding. High-riding cecum in the right mid abdomen. The appendix is normal. Moderate volume of stool throughout the colon. Colonic diverticulosis. No diverticulitis or acute colonic inflammation. Vascular/Lymphatic: Moderately advanced aortic atherosclerosis. Bi-iliac tortuosity. Patent portal and splenic veins. No acute vascular findings. No enlarged abdominopelvic lymph nodes. Reproductive: Unremarkable prostate gland. Other: No ascites, free air or focal fluid collection. Tiny fat containing umbilical hernia. No omental thickening. Musculoskeletal: Expansile right sacral lesion which has been recently assessed with PET. Ghost tracks within L5-S1. Diffuse lumbar degenerative change. Right hip arthroplasty IMPRESSION: 1. No acute abnormality in the abdomen/pelvis. 2.  Moderate volume of stool throughout the colon, can be seen with constipation. Colonic diverticulosis without diverticulitis. 3. Moderate hiatal hernia. 4. Known right sacral lesion is unchanged in the short interim from prior imaging. Aortic Atherosclerosis (ICD10-I70.0). Electronically Signed   By: MKeith RakeM.D.   On: 02/19/2021 22:19   CT CHEST ABDOMEN PELVIS W CONTRAST  Result Date: 02/02/2021 CLINICAL DATA:  LEFT sacral mass.  Large B-cell lymphoma on biopsy. EXAM: CT CHEST, ABDOMEN, AND PELVIS WITH CONTRAST TECHNIQUE: Multidetector CT imaging of the chest, abdomen and pelvis was performed following the standard protocol during bolus administration of intravenous contrast. CONTRAST:  1064mOMNIPAQUE IOHEXOL 300 MG/ML  SOLN COMPARISON:  Biopsy procedural CT 01/24/2021, lumbar spine MRI 01/12/2021 FINDINGS: CT CHEST FINDINGS Cardiovascular: Post CABG. No acute cardiac findings vascular findings. Mediastinum/Nodes: No axillary or supraclavicular adenopathy. No mediastinal or hilar adenopathy. No pericardial fluid. Esophagus normal. Moderate size sliding-type hiatal hernia. Lungs/Pleura: No suspicious pulmonary nodules. Nodule pleural parenchymal thickening in the posterior upper lobes is favored benign. Airways normal Musculoskeletal: No aggressive osseous lesion of thorax. Midline sternotomy. CT ABDOMEN AND PELVIS FINDINGS Hepatobiliary: No  focal hepatic lesion. No biliary ductal dilatation. Gallbladder is normal. Common bile duct is normal. Pancreas: Pancreas is normal. No ductal dilatation. No pancreatic inflammation. Spleen: Normal spleen Adrenals/urinary tract: Adrenal glands and kidneys are normal. The ureters and bladder normal. Stomach/Bowel: Stomach, small bowel, appendix, and cecum are normal. Multiple diverticula of the descending colon and sigmoid colon without acute inflammation. Vascular/Lymphatic: Abdominal aorta is normal caliber with atherosclerotic calcification. There is no  retroperitoneal or periportal lymphadenopathy. No pelvic lymphadenopathy. Reproductive: Unremarkable Other: No peritoneal disease Musculoskeletal: Parasacral mass with erosion into the posterior elements of the L5-S1 vertebral body measures 8.1 by 4.0 cm (image 91/2. No additional skeletal lesions are identified Post RIGHT hip arthroplasty. IMPRESSION: Chest Impression: 1. No evidence of thoracic metastasis. 2. No evidence of skeletal metastasis in the thorax. 3. Small hiatal hernia. 4. Post CABG. Abdomen / Pelvis Impression: 1. Large sacral and parasacral mass as described on comparison MRI. 2. No additional skeletal lesions are identified. 3. No evidence of metastatic adenopathy in the abdomen pelvis. 4. In patient with lymphoma, FDG PET scan and may be appropriate for pre therapy staging and baseline for follow-up of treatment. Electronically Signed   By: Suzy Bouchard M.D.   On: 02/02/2021 16:47   NM PET Image Initial (PI) Skull Base To Thigh  Result Date: 02/16/2021 CLINICAL DATA:  Subsequent treatment strategy for sacral mass, lymphoma. EXAM: NUCLEAR MEDICINE PET SKULL BASE TO THIGH TECHNIQUE: 9.1 mCi F-18 FDG was injected intravenously. Full-ring PET imaging was performed from the skull base to thigh after the radiotracer. CT data was obtained and used for attenuation correction and anatomic localization. Fasting blood glucose: 139 mg/dl COMPARISON:  02/08/2021 and CT chest abdomen pelvis 02/02/2021. FINDINGS: Mediastinal blood pool activity: SUV max 1.5 Liver activity: SUV max 2.8 NECK: No hypermetabolic lymph nodes. Incidental CT findings: None. CHEST: No abnormal hypermetabolism. Incidental CT findings: Atherosclerotic calcification of the aorta and aortic valve. Ascending aorta measures up to 4.4 cm, stable. No pericardial or pleural effusion. Moderate hiatal hernia. ABDOMEN/PELVIS: No abnormal hypermetabolism in the liver, adrenal glands, spleen or pancreas. No hypermetabolic lymph nodes.  Incidental CT findings: Liver, gallbladder, adrenal glands, kidneys, spleen and pancreas are unremarkable. Moderate hiatal hernia. Duodenal diverticulum. Small bowel, appendix and colon are otherwise unremarkable. Atherosclerotic calcification of the aorta. SKELETON: An expansile lytic mass in the right sacrum extends into the right sciatic region with an SUV max 14.2 superiorly and 17.1 inferiorly. Overall size appears grossly stable in the very short interval from 02/08/2021. No additional abnormal osseous hypermetabolism. Incidental CT findings: Right hip arthroplasty.  Degenerative changes in the spine. IMPRESSION: 1. Hypermetabolic lytic right sacral mass with extension into the right sciatic region, stable in size from 02/08/2021 and compatible with the provided history of lymphoma. No evidence of distant disease. 2. Ascending aortic aneurysm as before. Recommend annual imaging followup by CTA or MRA. This recommendation follows 2010 ACCF/AHA/AATS/ACR/ASA/SCA/SCAI/SIR/STS/SVM Guidelines for the Diagnosis and Management of Patients with Thoracic Aortic Disease. Circulation. 2010; 121: [id number]. Aortic aneurysm NOS (ICD10-I71.9) 3.  Aortic atherosclerosis (ICD10-I70.0). Electronically Signed   By: Lorin Picket M.D.   On: 02/16/2021 15:39   NM PET Image Initial (PI) Skull Base To Thigh  Result Date: 02/09/2021 CLINICAL DATA:  Initial treatment strategy for large B-cell lymphoma. EXAM: NUCLEAR MEDICINE PET SKULL BASE TO THIGH TECHNIQUE: 9.3 mCi F-18 FDG was injected intravenously. Full-ring PET imaging was performed from the skull base to thigh after the radiotracer. CT data was obtained and used for attenuation  correction and anatomic localization. Fasting blood glucose: 94 mg/dl COMPARISON:  Multiple exams, including CT scan 02/03/2020 FINDINGS: Mediastinal blood pool activity: SUV max 2.3 (taken from the abdominal aorta) Liver activity: SUV max 3.4 Unfortunately due to discomfort the patient terminated  the PET portion of today's examination with acquisition of only the proximal thighs through the kidneys, but not including vertical levels above the kidneys such as the upper portion of the upper abdomen, chest, and neck. Accordingly we have CT data but no PET data for those regions. The image region does include the only area of known definite tumor based on prior imaging, which is in the right hemipelvis region. NECK: No PET data available Incidental CT findings: Bilateral common carotid atherosclerotic calcifications. CHEST: No PET data available Incidental CT findings: Coronary, aortic arch, and branch vessel atherosclerotic vascular disease. Prior CABG. 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 Moderate-sized hiatal hernia. Right axillary clips noted. No pathologic adenopathy identified. Mild biapical pleuroparenchymal scarring. Mild scarring in the posterior basal segment left lower lobe. ABDOMEN/PELVIS: There is no PET data about the level of the kidneys, for example involving the spleen, and upper half of the liver. Right pelvic mass eroding the posterior margin of the right sacrum and medial margin of the right iliac bone noted in the right posterior paraspinal musculature, extending along the right S1 and S2 foramina and penetrating specially through the S2 foramen into the right presacral space, with some local mass effect on the adjacent sciatic nerve. The upper posterior paraspinal segment of this continuous mass has a maximum SUV of 20.2 (Deauville 5), and the presacral portion extending through the right sciatic notch along the piriformis muscle has maximum SUV of 22.4 (Deauville 5). Morphologically the mass appears similar to on recent CT examination of 02/02/2021, although the mass does not substantially cross midline and accordingly the measurement for the posterior paraspinal component is a little exaggerated on that exam. Currently the hypermetabolic activity associated with the  posterior paraspinal component measures about 4.7 by 4.9 by 8.1 cm, and the oval-shaped component tracking along the piriformis muscle measures about 8.0 by 4.2 by 3.4 cm. No separate hypermetabolic lesion is identified. Incidental CT findings: Atherosclerosis is present, including aortoiliac atherosclerotic disease. Sigmoid colon diverticulosis. Borderline prostatomegaly. SKELETON: Bony erosions of the right posterior sacrum and medial right iliac bone due to the posterior paraspinal component of the pelvic mass identified. No other hypermetabolic activity is identified from the L1-2 level down. Level cephalad to the L1-2 level do not have associated PET data. Incidental CT findings: Degenerative glenohumeral arthropathy bilaterally. Lumbar spondylosis and degenerative disc disease. IMPRESSION: 1. Unfortunately due to discomfort the patient terminated the PET portion of today's examination with acquisition of only the proximal thighs through the kidneys, but not including vertical levels above the kidneys such as the upper portion of the upper abdomen, the chest, and the neck. There is no known involvement in the region omitted on the PET data based on prior imaging. 2. The known mass of the right posterior paraspinal musculature along the upper sacrum tracking through the S1 and S2 neural foramina into the right piriformis muscle and subsequently out through the sciatic notch is Deauville 5 with measurements as noted above. This erodes the posterior sacrum and part of the adjacent iliac bone. 3. 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 Recommend annual imaging followup by CTA or MRA. This recommendation follows 2010 ACCF/AHA/AATS/ACR/ASA/SCA/SCAI/SIR/STS/SVM Guidelines for the Diagnosis and Management of Patients with Thoracic Aortic  Disease. Circulation. 2010; 121: L937-T024. Aortic aneurysm NOS (ICD10-I71.9) 4. Other imaging findings of potential clinical significance: Aortic Atherosclerosis  (ICD10-I70.0). Coronary atherosclerosis with prior CABG. Moderate-sized hiatal hernia. Sigmoid colon diverticulosis. Borderline prostatomegaly. Lumbar spondylosis and degenerative disc disease. Electronically Signed   By: Van Clines M.D.   On: 02/09/2021 12:03   DG Chest Portable 1 View  Result Date: 02/19/2021 CLINICAL DATA:  Evaluate for infiltrate. EXAM: PORTABLE CHEST 1 VIEW COMPARISON:  CT dated 02/02/2021. FINDINGS: Right-sided Port-A-Cath with tip over central SVC. Minimal bibasilar atelectasis. No focal consolidation, pleural effusion, pneumothorax. The cardiac silhouette is within limits. Atherosclerotic calcification of the aorta. Median sternotomy wires and CABG vascular clips. No acute osseous pathology. IMPRESSION: No active disease. Electronically Signed   By: Anner Crete M.D.   On: 02/19/2021 21:43   CT BONE MARROW BIOPSY  Result Date: 02/09/2021 INDICATION: B-cell lymphoma EXAM: CT BIOPSY BONE MARROW MEDICATIONS: None. ANESTHESIA/SEDATION: Moderate (conscious) sedation was employed during this procedure. A total of Versed 1 mg and Fentanyl 25 mcg was administered intravenously. Moderate Sedation Time: 15 minutes. The patient's level of consciousness and vital signs were monitored continuously by radiology nursing throughout the procedure under my direct supervision. FLUOROSCOPY TIME:  N/a COMPLICATIONS: None immediate. PROCEDURE: Informed written consent was obtained from the patient after a thorough discussion of the procedural risks, benefits and alternatives. All questions were addressed. Maximal Sterile Barrier Technique was utilized including caps, mask, sterile gowns, sterile gloves, sterile drape, hand hygiene and skin antiseptic. A timeout was performed prior to the initiation of the procedure. The patient was placed prone on the CT exam table. Limited CT of the pelvis was performed for planning purposes. Skin entry site was marked, and the overlying skin was prepped and  draped in the standard sterile fashion. Local analgesia was obtained with 1% lidocaine. Using CT guidance, an 11 gauge needle was advanced just deep to the cortex of the right posterior ilium. Subsequently, bone marrow aspiration and core biopsy were performed. Specimens were submitted to lab/pathology for handling. Hemostasis was achieved with manual pressure, and a clean dressing was placed. The patient tolerated the procedure well without immediate complication. IMPRESSION: Successful CT-guided bone marrow aspiration and core biopsy of the right posterior ilium. Electronically Signed   By: Albin Felling M.D.   On: 02/09/2021 10:33   ECHOCARDIOGRAM COMPLETE  Result Date: 02/15/2021    ECHOCARDIOGRAM REPORT   Patient Name:   AJAI HARVILLE Date of Exam: 02/15/2021 Medical Rec #:  [id number]      Height:       69.0 in Accession #:    [id number]     Weight:       163.0 lb Date of Birth:  16-Apr-1945       BSA:          1.894 m Patient Age:    28 years       BP:           163/84 mmHg Patient Gender: M              HR:           82 bpm. Exam Location:  [facility] Procedure: 2D Echo, Cardiac Doppler and Color Doppler Indications:     Chemo Z09  History:         Patient has no prior history of Echocardiogram examinations.                  Previous Myocardial Infarction and CAD, Prior  CABG; Risk                  Factors:Hypertension and Dyslipidemia.  Sonographer:     Sherrie Sport Referring Phys:  [id number] Lloyd Huger Diagnosing Phys: Nelva Bush MD  Sonographer Comments: Suboptimal apical window and suboptimal subcostal window. IMPRESSIONS  1. Left ventricular ejection fraction, by estimation, is 60 to 65%. The left ventricle has normal function. The left ventricle has no regional wall motion abnormalities. The left ventricular internal cavity size was mildly dilated. Left ventricular diastolic parameters are consistent with Grade I diastolic dysfunction (impaired relaxation).  2. Right ventricular systolic function is  mildly reduced. The right ventricular size is normal. Mildly increased right ventricular wall thickness.  3. The mitral valve is grossly normal. No evidence of mitral valve regurgitation. No evidence of mitral stenosis.  4. The aortic valve is tricuspid. There is mild calcification of the aortic valve. There is mild thickening of the aortic valve. Aortic valve regurgitation is mild.  5. Aortic dilatation noted. 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 FINDINGS  Left Ventricle: Left ventricular ejection fraction, by estimation, is 60 to 65%. The left ventricle has normal function. The left ventricle has no regional wall motion abnormalities. The left ventricular internal cavity size was mildly dilated. There is  no left ventricular hypertrophy. Abnormal (paradoxical) septal motion consistent with post-operative status. Left ventricular diastolic parameters are consistent with Grade I diastolic dysfunction (impaired relaxation). Right Ventricle: The right ventricular size is normal. Mildly increased right ventricular wall thickness. Right ventricular systolic function is mildly reduced. Left Atrium: Left atrial size was normal in size. Right Atrium: Right atrial size was normal in size. Pericardium: There is no evidence of pericardial effusion. Mitral Valve: The mitral valve is grossly normal. No evidence of mitral valve regurgitation. No evidence of mitral valve stenosis. MV peak gradient, 2.4 mmHg. The mean mitral valve gradient is 1.0 mmHg. Tricuspid Valve: The tricuspid valve is not well visualized. Tricuspid valve regurgitation is mild. Aortic Valve: The aortic valve is tricuspid. There is mild calcification of the aortic valve. There is mild thickening of the aortic valve. Aortic valve regurgitation is mild. Aortic valve mean gradient measures 2.0 mmHg. Aortic valve peak gradient measures 3.6 mmHg. Aortic valve area, by VTI measures 4.86 cm. Pulmonic Valve: The pulmonic valve was not well  visualized. Pulmonic valve regurgitation is not visualized. No evidence of pulmonic stenosis. Aorta: Aortic dilatation noted. 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 Pulmonary Artery: The pulmonary artery is of normal size. IAS/Shunts: The interatrial septum was not well visualized.  LEFT VENTRICLE PLAX 2D LVIDd:         3.70 cm   Diastology LVIDs:         2.20 cm   LV e' medial:    5.77 cm/s LV PW:         1.30 cm   LV E/e' medial:  8.7 LV IVS:        1.00 cm   LV e' lateral:   14.30 cm/s LVOT diam:     2.20 cm   LV E/e' lateral: 3.5 LV SV:         69 LV SV Index:   36 LVOT Area:     3.80 cm  RIGHT VENTRICLE RV Basal diam:  4.00 cm RV S prime:     9.79 cm/s TAPSE (M-mode): 1.2 cm LEFT ATRIUM  Index        RIGHT ATRIUM           Index LA diam:        1.90 cm 1.00 cm/m   RA Area:     12.90 cm LA Vol (A2C):   29.4 ml 15.52 ml/m  RA Volume:   30.70 ml  16.21 ml/m LA Vol (A4C):   15.3 ml 8.08 ml/m LA Biplane Vol: 22.2 ml 11.72 ml/m  AORTIC VALVE                    PULMONIC VALVE AV Area (Vmax):    3.75 cm     PV Vmax:        0.67 m/s AV Area (Vmean):   4.06 cm     PV Vmean:       45.400 cm/s AV Area (VTI):     4.86 cm     PV VTI:         0.097 m AV Vmax:           95.40 cm/s   PV Peak grad:   1.8 mmHg AV Vmean:          65.700 cm/s  PV Mean grad:   1.0 mmHg AV VTI:            0.142 m      RVOT Peak grad: 2 mmHg AV Peak Grad:      3.6 mmHg AV Mean Grad:      2.0 mmHg LVOT Vmax:         94.00 cm/s LVOT Vmean:        70.200 cm/s LVOT VTI:          0.181 m LVOT/AV VTI ratio: 1.28  AORTA Ao Root diam: 4.10 cm MITRAL VALVE               TRICUSPID VALVE MV Area (PHT): 2.43 cm    TR Peak grad:   14.6 mmHg MV Area VTI:   3.98 cm    TR Vmax:        191.00 cm/s MV Peak grad:  2.4 mmHg MV Mean grad:  1.0 mmHg    SHUNTS MV Vmax:       0.78 m/s    Systemic VTI:  0.18 m MV Vmean:      40.9 cm/s   Systemic Diam: 2.20 cm MV Decel Time: 312 msec    Pulmonic VTI:  0.081 m MV E velocity: 50.10  cm/s MV A velocity: 56.10 cm/s MV E/A ratio:  0.89 Christopher End MD Electronically signed by Nelva Bush MD Signature Date/Time: 02/15/2021/9:47:17 AM    Final    IR IMAGING GUIDED PORT INSERTION  Result Date: 02/11/2021 INDICATION: b cell lymphoma EXAM: 1. Ultrasound-guided puncture of the right internal jugular vein 2. Placement of a right-sided chest port using fluoroscopic guidance MEDICATIONS: None ANESTHESIA/SEDATION: Moderate (conscious) sedation was employed during this procedure. A total of Versed 2 mg and Fentanyl 100 mcg was administered intravenously. Moderate Sedation Time: 26 minutes. The patient's level of consciousness and vital signs were monitored continuously by radiology nursing throughout the procedure under my direct supervision. FLUOROSCOPY TIME:  Fluoroscopy Time: 0.7 minutes with 3 exposures COMPLICATIONS: None immediate. PROCEDURE: Informed written consent was obtained from the patient after a thorough discussion of the procedural risks, benefits and alternatives. All questions were addressed. Maximal Sterile Barrier Technique was utilized including caps, mask, sterile gowns, sterile gloves, sterile drape, hand hygiene and skin antiseptic.  A timeout was performed prior to the initiation of the procedure. The patient was placed supine on the exam table. The right neck and chest was prepped and draped in the standard sterile fashion. A preliminary ultrasound of the right neck was performed and demonstrates a patent right internal jugular vein. A permanent ultrasound image was stored in the electronic medical record. The overlying skin was anesthetized with 1% Lidocaine. Using ultrasound guidance, access was obtained into the right internal jugular vein using a 21 gauge micropuncture set. A wire was advanced into the SVC, a short incision was made at the puncture site, and serial dilatation performed. Next, in an ipsilateral infraclavicular location, an incision was made at the site  of the subcutaneous reservoir. Blunt dissection was used to open a pocket to contain the reservoir. A subcutaneous tunnel was then created from the port site to the puncture site. A(n) 8 Fr single lumen catheter was advanced through the tunnel. The catheter was attached to the port and this was placed in the subcutaneous pocket. Under fluoroscopic guidance, a peel away sheath was placed, and the catheter was trimmed to the appropriate length and was advanced into the central veins. The catheter length is 26 cm. The tip of the catheter lies near the superior cavoatrial junction. The port flushes and aspirates appropriately. The port was flushed and locked with heparinized saline. The port pocket was closed in 2 layers using 3-0 and 4-0 Vicryl/absorbable suture. Dermabond was also applied to both incisions. The patient tolerated the procedure well and was transferred to recovery in stable condition. IMPRESSION: Successful placement of a right chest port via the right internal jugular vein. The port is ready for immediate use. Electronically Signed   By: Albin Felling M.D.   On: 02/11/2021 12:02   DG FLUORO GUIDED LOC OF NEEDLE/CATH TIP FOR SPINAL INJECT LT  Result Date: 02/04/2021 CLINICAL DATA:  Patient with B-cell lymphoma request received for diagnostic lumbar puncture. EXAM: DIAGNOSTIC LUMBAR PUNCTURE UNDER FLUOROSCOPIC GUIDANCE COMPARISON:  MR lumbar spine performed 01/12/2021 reviewed prior to the procedure. FLUOROSCOPY TIME:  Fluoroscopy Time: 0.8 Minute Radiation Exposure Index (if provided by the fluoroscopic device): 17.80 mGy Number of Acquired Spot Images: 3 PROCEDURE: Informed consent was obtained from the patient/patient's legal guardian prior to the procedure, including potential complications of bleeding, infection, paresthesias, nerve damage, CSF leak requiring additional procedures, post procedure requirement to lay flat for several hours after the procedure, headache, allergy, and pain. With  the patient prone, the lower back was prepped with Betadine. 1% Lidocaine was used for local anesthesia. Attempted lumbar puncture was performed at the L2-L3 however unable to get fluid back, inner stylet of the needle was replaced and the needle was removed in it's entirety. Lumbar puncture was then performed at the L1-L2 level using a 20 gauge needle with return of slight blood-tinged at first which turned to colorless CSF. 15 ml of CSF were obtained for laboratory studies. The inner stylet was placed back into the needle and the needle was removed in its entirety. The patient tolerated the procedure well and there were no apparent complications. A sterile bandage was applied. IMPRESSION: Technically successful fluoroscopic guided lumbar puncture. This exam was performed by Tsosie Billing PA-C, and was supervised and interpreted by Dr. Jearld Lesch. Electronically Signed   By: Kathreen Devoid M.D.   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  Stage I E bulky large  B-cell lymphoma: Lumbar spine MRI results from January 12, 2021 reviewed independently with a 6.3 cm soft tissue mass involving the lower right paraspinous muscles.  There is also some enhancing tumor in the right S1 neuroforamen displacing the nerve root anteriorly.  Biopsy confirmed diagnosis.  PET scan and bone marrow biopsy did not reveal any distant disease.  Spinal fluid was negative for malignancy.  Patient has had port placement.  Echo revealed EF of 65% and adequate to proceed with treatment.  Patient will likely receive 4 cycles of R-CHOP chemotherapy with Udenyca support every 3 weeks and then referral to radiation oncology for adjuvant XRT given the bulky nature of patient's disease.  Return to clinic in 1 week for further evaluation and consideration of cycle 1 of R-CHOP.  2.  Back pain: Continue hydrocodone as prescribed.  I spent a total of 30 minutes reviewing chart data, face-to-face  evaluation with the patient, counseling and coordination of care as detailed above.    Patient expressed understanding and was in agreement with this plan. He also understands that He can call clinic at any time with any questions, concerns, or complaints.    Cancer Staging  Large B-cell lymphoma ([place]) Staging form: Hodgkin and Non-Hodgkin Lymphoma, AJCC 8th Edition - Clinical stage from 02/23/2021: Stage II bulky (Diffuse large B-cell lymphoma) - Signed by Lloyd Huger, MD on 02/23/2021 Stage prefix: Initial diagnosis   Lloyd Huger, MD   02/23/2021 4:52 PM

## 2021-02-23 ENCOUNTER — Other Ambulatory Visit: Payer: Self-pay | Admitting: *Deleted

## 2021-02-23 ENCOUNTER — Encounter: Payer: Self-pay | Admitting: Oncology

## 2021-02-23 DIAGNOSIS — C8510 Unspecified B-cell lymphoma, unspecified site: Secondary | ICD-10-CM

## 2021-02-23 MED ORDER — HYDROCODONE-ACETAMINOPHEN 5-325 MG PO TABS: 1.0000 | ORAL_TABLET | Freq: Once | ORAL | 0 refills | Status: AC

## 2021-02-23 MED ORDER — LIDOCAINE-PRILOCAINE 2.5-2.5 % EX CREA: TOPICAL_CREAM | CUTANEOUS | 3 refills | Status: AC

## 2021-02-23 MED ORDER — PROCHLORPERAZINE MALEATE 10 MG PO TABS: 10.0000 mg | ORAL_TABLET | Freq: Four times a day (QID) | ORAL | 2 refills | Status: AC | PRN

## 2021-02-23 MED ORDER — ONDANSETRON HCL 8 MG PO TABS: 8.0000 mg | ORAL_TABLET | Freq: Two times a day (BID) | ORAL | 2 refills | Status: AC | PRN

## 2021-02-23 MED ORDER — PREDNISONE 20 MG PO TABS: 100.0000 mg | ORAL_TABLET | Freq: Every day | ORAL | 5 refills | Status: AC

## 2021-02-23 MED ORDER — ALLOPURINOL 300 MG PO TABS: 300.0000 mg | ORAL_TABLET | Freq: Every day | ORAL | 3 refills | Status: AC

## 2021-02-23 NOTE — Progress Notes (Signed)
START ON PATHWAY REGIMEN - Lymphoma and CLL     A cycle is every 21 days:     Prednisone      Rituximab-xxxx      Cyclophosphamide      Doxorubicin      Vincristine   **Always confirm dose/schedule in your pharmacy ordering system**  Patient Characteristics: Diffuse Large B-Cell Lymphoma or Follicular Lymphoma, Grade 3B, First Line, Stage I and II, Bulky Disease (10 cm or > 1/3 Diameter of Chest) Disease Type: Not Applicable Disease Type: Diffuse Large B-Cell Lymphoma Disease Type: Not Applicable Line of therapy: First Line Disease Characteristics: Bulky Disease Intent of Therapy: Curative Intent, Discussed with Patient 

## 2021-02-24 ENCOUNTER — Telehealth: Payer: Self-pay | Admitting: *Deleted

## 2021-02-24 ENCOUNTER — Other Ambulatory Visit: Payer: Self-pay | Admitting: Emergency Medicine

## 2021-02-24 ENCOUNTER — Other Ambulatory Visit: Payer: Self-pay | Admitting: Oncology

## 2021-02-24 DIAGNOSIS — C8510 Unspecified B-cell lymphoma, unspecified site: Secondary | ICD-10-CM

## 2021-02-24 LAB — CULTURE, BLOOD (ROUTINE X 2)
Culture: NO GROWTH
Culture: NO GROWTH
Special Requests: ADEQUATE
Special Requests: ADEQUATE

## 2021-02-24 MED ORDER — HYDROCODONE-ACETAMINOPHEN 5-325 MG PO TABS: 1.0000 | ORAL_TABLET | Freq: Four times a day (QID) | ORAL | 0 refills | Status: DC | PRN

## 2021-02-24 NOTE — Telephone Encounter (Signed)
Pharmacy called questioning direction vs quantity of Hydrocodone prescription. Pleae clarify  HYDROcodone-acetaminophen (NORCO/VICODIN) 5-325 MG tablet [[id number]]  ENDED   Order Details Dose: 1 tablet Route: Oral Frequency:  Once  Dispense Quantity: 30 tablet Refills: 0        Sig: Take 1 tablet by mouth once for 1 dose.       Start Date: 02/23/21 End Date: 02/23/21 after 1 doses  Written Date: 02/23/21 Expiration Date: 08/22/21  Earliest Fill Date: 02/23/21       Diagnosis Association: Large B-cell lymphoma ([place]) (C85.10)  Providers  Ordering and Authorizing Provider:   Lloyd Huger, MD  [address] [place], [place] [address]  Phone:  [phone]   Fax:  [phone]  DEA #:  [id number]   NPI:  [id number]

## 2021-02-28 ENCOUNTER — Other Ambulatory Visit: Payer: Commercial Managed Care - PPO

## 2021-03-01 ENCOUNTER — Inpatient Hospital Stay: Payer: Medicare Other

## 2021-03-01 ENCOUNTER — Other Ambulatory Visit: Payer: Self-pay

## 2021-03-01 ENCOUNTER — Inpatient Hospital Stay (HOSPITAL_BASED_OUTPATIENT_CLINIC_OR_DEPARTMENT_OTHER): Payer: Medicare Other | Admitting: Hospice and Palliative Medicine

## 2021-03-01 VITALS — BP 158/87 | HR 99 | Temp 98.8°F | Resp 17

## 2021-03-01 DIAGNOSIS — Z5112 Encounter for antineoplastic immunotherapy: Secondary | ICD-10-CM | POA: Diagnosis not present

## 2021-03-01 DIAGNOSIS — G893 Neoplasm related pain (acute) (chronic): Secondary | ICD-10-CM

## 2021-03-01 DIAGNOSIS — C8510 Unspecified B-cell lymphoma, unspecified site: Secondary | ICD-10-CM

## 2021-03-01 DIAGNOSIS — M533 Sacrococcygeal disorders, not elsewhere classified: Secondary | ICD-10-CM

## 2021-03-01 MED ORDER — OXYCODONE HCL 5 MG PO TABS: 5.0000 mg | ORAL_TABLET | ORAL | 0 refills | Status: AC | PRN

## 2021-03-01 MED ORDER — OXYCODONE HCL 5 MG PO TABS
5.0000 mg | ORAL_TABLET | Freq: Once | ORAL | Status: AC
  Administered 2021-03-01: 5 mg via ORAL
  Filled 2021-03-01: qty 1

## 2021-03-01 NOTE — Progress Notes (Signed)
Pt medicated with oxycodone 5mg  po x1. Rx sent to [facility]. Pt instructed to pick up medication and to take another dose in 1 hour if no improvement after initial dose. Pt verbalized understanding.

## 2021-03-01 NOTE — Progress Notes (Signed)
Symptom Management [place] at 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  Patient has had severe low back/right hip pain.  He presented to [facility] for evaluation management of worsening pain.  Patient denies weakness in lower extremities.  No changes in bowel or bladder.  He describes pain as being severe and a burning sensation in the low back/hip.  He has been taking Norco 5-325 mg (1 to 2 tablets) every 4 hours and that was initially controlling the pain but he feels like pain is now poorly controlled.  Patient says the pain was better on previous prescription of Norco 7-325 mg if he took 2 tablets.  He denies any adverse effects from pain medications.  Denies any neurologic complaints. Denies recent fevers or illnesses. Denies any easy bleeding or bruising. Reports good appetite and denies weight loss. Denies chest pain. Denies any nausea, vomiting, constipation, or diarrhea. Denies urinary complaints. Patient offers no further specific complaints today.  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   a.)  s/p RIGHT sided PTA and stenting   MDD (major  depressive disorder)    Myocardial infarction ([place]) 02/21/2006   Neuropathy    OSA (obstructive sleep apnea)    Peripheral vascular disease (HCC)    Postlaminectomy syndrome of lumbar region 09/21/2011   Postoperative atrial fibrillation (HCC)    Statin intolerance    Thoracic aortic aneurysm    s/p dissection and repair in 08/2012    PAST SURGICAL HISTORY:  Past Surgical History:  Procedure Laterality Date   ANGIOPLASTY / STENTING ILIAC Right 04/03/2006   Procedure(s): abdominal aortography, bilateral lower extremity runoff, iliac PTA and stenting; Location: [facility]; Surgeon: Jordan Ano, MD   BLEPHAROPLASTY Bilateral 05/06/2019   Procedure: BLEPHAROPLASTY, UPPER EYELID; WITH EXCESSIVE SKIN WEIGHTING DOWN LID; Surgeon: Jordan Lame, MD; Location: [place]; Service: Ophthalmology; Laterality: Bilateral   CARDIAC CATHETERIZATION N/A 03/15/2006   Location: [facility]; Surgeon: Jordan Parr, MD   CARDIAC CATHETERIZATION N/A 03/23/2011   Location: [facility]; Surgeon: Jordan Mariner, MD   CATARACT EXTRACTION W/ INTRAOCULAR LENS IMPLANT Right 03/04/2019   Procedure: EXTRACAPSULAR CATARACT PHACO REMOVAL WITH INSERTION OF INTRAOCULAR LENS PROSTHESIS (1 STAGE PROCEDURE), MANUAL OR MECHANICAL TECHNIQUE; Surgeon: Jordan Grief, MD; Location: [facility]; Service: Ophthalmology; Laterality: Right; incision approximated   CATARACT EXTRACTION W/ INTRAOCULAR LENS IMPLANT Left 02/18/2019   Procedure: DEXTENZA- EXTRACAPSULAR CATARACT ECCE REMOVAL WITH INSERTION OF INTRAOCULAR LENS PROSTHESIS (1 STAGE PROCEDURE), MANUAL OR MECHANICAL TECHNIQUE; Surgeon: Jordan Grief, MD; Location: [facility]; Service: Ophthalmology; Laterality: Left   COLONOSCOPY     CORONARY  ARTERY BYPASS GRAFT N/A 03/29/2011   4v; LIMA-LAD, SVG-RI, SVG-D1, SVG-RCA   ELECTROCONVULSIVE THERAPY N/A    Multiple treatments   FRACTURE SURGERY     IR IMAGING GUIDED PORT INSERTION  02/11/2021   LUMBAR LAMINECTOMY N/A 02/2004   L4-L5 and  L5-S1   PERCUTANEOUS CORONARY STENT INTERVENTION (PCI-S) N/A 02/21/2006   Procedure: PCI with 3.5 x 16 mm Liberte BMS x 1 to RCA; Location: [facility]; Surgeon: Jordan Ano, MD   PERCUTANEOUS CORONARY STENT INTERVENTION (PCI-S) N/A 09/18/2006   Procedure: overlapping 3.5 x 18 mm Xience DES to in stent of BMS in RCA; Location: [facility]; Surgeon: Jordan Neighbours, MD   REPAIR OF ACUTE ASCENDING THORACIC AORTIC DISSECTION N/A 08/14/2012   Redo sternotomy for repair Type A dissection with ascending aortic graft, aortic valve resuspension, aortic valve central plication of non-coronary cusp, STJ remodeling; Location: [facility]; Surgeon: Jordan Mason   REVISION TOTAL KNEE ARTHROPLASTY Right 11/2009   TONSILLECTOMY     TOTAL HIP ARTHROPLASTY Right 08/24/2020   Procedure: TOTAL HIP ARTHROPLASTY ANTERIOR APPROACH;  Surgeon: Jordan Knows, MD;  Location: [facility];  Service: Orthopedics;  Laterality: Right;   TOTAL KNEE ARTHROPLASTY Right 02/2008    HEMATOLOGY/ONCOLOGY HISTORY:  Oncology History  Large B-cell lymphoma ([place])  01/27/2021 Initial Diagnosis   Large B-cell lymphoma ([place])   02/23/2021 Cancer Staging   Staging form: Hodgkin and Non-Hodgkin Lymphoma, AJCC 8th Edition - Clinical stage from 02/23/2021: Stage II bulky (Diffuse large B-cell lymphoma) - Signed by Jordan Huger, MD on 02/23/2021 Stage prefix: Initial diagnosis    03/03/2021 -  Chemotherapy   Patient is on Treatment Plan : NON-HODGKINS LYMPHOMA R-CHOP q21d       ALLERGIES:  is allergic to vortioxetine, atorvastatin, bupropion, morphine, pravastatin, rosuvastatin, felodipine, hydrochlorothiazide w-triamterene, lisinopril, and metoprolol.  MEDICATIONS:  Current Outpatient Medications  Medication Sig Dispense Refill   oxyCODONE (OXY IR/ROXICODONE) 5 MG immediate release tablet Take 1-2 tablets (5-10 mg total) by mouth every 4 (four) hours as needed for severe pain. 60 tablet 0   allopurinol (ZYLOPRIM) 300 MG tablet Take 1 tablet (300 mg total)  by mouth daily. 30 tablet 3   amphetamine-dextroamphetamine (ADDERALL) 20 MG tablet Take 20 mg by mouth 2 (two) times daily.     AUSTEDO 9 MG TABS Take 9 mg by mouth 2 (two) times daily. (Patient not taking: Reported on 02/20/2021)     benazepril (LOTENSIN) 20 MG tablet Take 20 mg by mouth at bedtime.     celecoxib (CELEBREX) 200 MG capsule Take 1 capsule by mouth 2 (two) times daily.     cetirizine (ZYRTEC) 10 MG tablet Take 10 mg by mouth daily as needed for allergies.     Cholecalciferol (VITAMIN D3 SUPER STRENGTH) 50 MCG (2000 UT) TABS Take 2,000 Units by mouth daily.     dexamethasone (DECADRON) 4 MG tablet Take 1 tablet (4 mg total) by mouth daily. 30 tablet 3   escitalopram (LEXAPRO) 20 MG tablet Take 20 mg by mouth at bedtime.     fluticasone (FLONASE) 50 MCG/ACT nasal spray Place 2 sprays into the nose daily.     ibuprofen (ADVIL) 200 MG tablet Take 800 mg by mouth daily as needed.     LATUDA 40 MG TABS tablet Take 40 mg by mouth at bedtime.     lidocaine-prilocaine (EMLA) cream Apply to affected area once 30 g 3   Multiple Vitamins-Minerals (MULTIVITAMIN WITH MINERALS) tablet Take 1 tablet by mouth daily.     ondansetron (  ZOFRAN) 8 MG tablet Take 1 tablet (8 mg total) by mouth 2 (two) times daily as needed for refractory nausea / vomiting. 60 tablet 2   pantoprazole (PROTONIX) 40 MG tablet Take 40 mg by mouth daily.     polyethylene glycol (MIRALAX / GLYCOLAX) 17 g packet Take 17 g by mouth daily. 14 each 1   predniSONE (DELTASONE) 20 MG tablet Take 5 tablets (100 mg total) by mouth daily. Take with food on days 1-5 of chemotherapy. 25 tablet 5   prochlorperazine (COMPAZINE) 10 MG tablet Take 1 tablet (10 mg total) by mouth every 6 (six) hours as needed (Nausea or vomiting). 60 tablet 2   SYMBICORT 80-4.5 MCG/ACT inhaler Inhale 2 puffs into the lungs 2 (two) times daily.     testosterone cypionate (DEPOTESTOSTERONE CYPIONATE) 200 MG/ML injection Inject 200 mg into the muscle every 14  (fourteen) days.     TIADYLT ER 360 MG 24 hr capsule Take 360 mg by mouth at bedtime.     traMADol (ULTRAM) 50 MG tablet Take 1 tablet (50 mg total) by mouth every 6 (six) hours. (Patient not taking: Reported on 02/22/2021) 30 tablet 0   VASCEPA 1 g capsule Take 2 g by mouth 2 (two) times daily.     No current facility-administered medications for this visit.    VITAL SIGNS: BP (!) 158/87    Pulse 99    Temp 98.8 F (37.1 C) (Tympanic)    Resp 17    SpO2 99%  There were no vitals filed for this visit.  Estimated body mass index is 23.18 kg/m as calculated from the following:   Height as of 02/11/21: _0  (1.753 m).   Weight as of 02/22/21: 157 lb (71.2 kg).  LABS: CBC:    Component Value Date/Time   WBC 14.2 (H) 02/20/2021 0713   HGB 14.3 02/20/2021 0713   HCT 39.9 02/20/2021 0713   PLT 361 02/20/2021 0713   MCV 90.1 02/20/2021 0713   NEUTROABS 9.6 (H) 02/19/2021 1604   LYMPHSABS 4.2 (H) 02/19/2021 1604   MONOABS 1.3 (H) 02/19/2021 1604   EOSABS 0.2 02/19/2021 1604   BASOSABS 0.1 02/19/2021 1604   Comprehensive Metabolic Panel:    Component Value Date/Time   NA 135 02/19/2021 1604   K 3.6 02/19/2021 1604   CL 102 02/19/2021 1604   CO2 22 02/19/2021 1604   BUN 17 02/19/2021 1604   CREATININE 0.84 02/20/2021 0713   GLUCOSE 154 (H) 02/19/2021 1604   CALCIUM 10.4 (H) 02/19/2021 1604   AST 26 02/19/2021 1604   ALT 17 02/19/2021 1604   ALKPHOS 66 02/19/2021 1604   BILITOT 1.0 02/19/2021 1604   PROT 8.0 02/19/2021 1604   ALBUMIN 4.3 02/19/2021 1604    RADIOGRAPHIC STUDIES: CT ABDOMEN PELVIS W CONTRAST  Result Date: 02/19/2021 CLINICAL DATA:  Nausea and vomiting. Diarrhea and decreased appetite. EXAM: CT ABDOMEN AND PELVIS WITH CONTRAST TECHNIQUE: Multidetector CT imaging of the abdomen and pelvis was performed using the standard protocol following bolus administration of intravenous contrast. CONTRAST:  134m OMNIPAQUE IOHEXOL 300 MG/ML  SOLN COMPARISON:  PET CT 3 days  ago.  Chest abdomen pelvis CT 02/02/2021 FINDINGS: Lower chest: Subsegmental atelectasis or scar in the right lower lobe. No pleural effusion or acute airspace disease. Unchanged hiatal hernia. Hepatobiliary: Focal fatty infiltration adjacent to the falciform ligament. No focal liver lesion. Mild gallbladder distention without calcified gallstone or pericholecystic fat stranding. No biliary dilatation. Pancreas: No ductal dilatation or inflammation. Spleen:  Normal in size without focal abnormality. Adrenals/Urinary Tract: Normal adrenal glands. Lobulated bilateral renal contours without hydronephrosis. No perinephric edema. Homogeneous renal enhancement with symmetric excretion on delayed phase imaging. No focal renal abnormality or visualized stone. Unremarkable urinary bladder. Stomach/Bowel: Moderate hiatal hernia. Small duodenal diverticulum. No small bowel obstruction or abnormal distention. Occasional fluid-filled distal small bowel without perienteric fat stranding. High-riding cecum in the right mid abdomen. The appendix is normal. Moderate volume of stool throughout the colon. Colonic diverticulosis. No diverticulitis or acute colonic inflammation. Vascular/Lymphatic: Moderately advanced aortic atherosclerosis. Bi-iliac tortuosity. Patent portal and splenic veins. No acute vascular findings. No enlarged abdominopelvic lymph nodes. Reproductive: Unremarkable prostate gland. Other: No ascites, free air or focal fluid collection. Tiny fat containing umbilical hernia. No omental thickening. Musculoskeletal: Expansile right sacral lesion which has been recently assessed with PET. Ghost tracks within L5-S1. Diffuse lumbar degenerative change. Right hip arthroplasty IMPRESSION: 1. No acute abnormality in the abdomen/pelvis. 2. Moderate volume of stool throughout the colon, can be seen with constipation. Colonic diverticulosis without diverticulitis. 3. Moderate hiatal hernia. 4. Known right sacral lesion is  unchanged in the short interim from prior imaging. Aortic Atherosclerosis (ICD10-I70.0). Electronically Signed   By: Keith Rake M.D.   On: 02/19/2021 22:19   CT CHEST ABDOMEN PELVIS W CONTRAST  Result Date: 02/02/2021 CLINICAL DATA:  LEFT sacral mass.  Large B-cell lymphoma on biopsy. EXAM: CT CHEST, ABDOMEN, AND PELVIS WITH CONTRAST TECHNIQUE: Multidetector CT imaging of the chest, abdomen and pelvis was performed following the standard protocol during bolus administration of intravenous contrast. CONTRAST:  14m OMNIPAQUE IOHEXOL 300 MG/ML  SOLN COMPARISON:  Biopsy procedural CT 01/24/2021, lumbar spine MRI 01/12/2021 FINDINGS: CT CHEST FINDINGS Cardiovascular: Post CABG. No acute cardiac findings vascular findings. Mediastinum/Nodes: No axillary or supraclavicular adenopathy. No mediastinal or hilar adenopathy. No pericardial fluid. Esophagus normal. Moderate size sliding-type hiatal hernia. Lungs/Pleura: No suspicious pulmonary nodules. Nodule pleural parenchymal thickening in the posterior upper lobes is favored benign. Airways normal Musculoskeletal: No aggressive osseous lesion of thorax. Midline sternotomy. CT ABDOMEN AND PELVIS FINDINGS Hepatobiliary: No focal hepatic lesion. No biliary ductal dilatation. Gallbladder is normal. Common bile duct is normal. Pancreas: Pancreas is normal. No ductal dilatation. No pancreatic inflammation. Spleen: Normal spleen Adrenals/urinary tract: Adrenal glands and kidneys are normal. The ureters and bladder normal. Stomach/Bowel: Stomach, small bowel, appendix, and cecum are normal. Multiple diverticula of the descending colon and sigmoid colon without acute inflammation. Vascular/Lymphatic: Abdominal aorta is normal caliber with atherosclerotic calcification. There is no retroperitoneal or periportal lymphadenopathy. No pelvic lymphadenopathy. Reproductive: Unremarkable Other: No peritoneal disease Musculoskeletal: Parasacral mass with erosion into the  posterior elements of the L5-S1 vertebral body measures 8.1 by 4.0 cm (image 91/2. No additional skeletal lesions are identified Post RIGHT hip arthroplasty. IMPRESSION: Chest Impression: 1. No evidence of thoracic metastasis. 2. No evidence of skeletal metastasis in the thorax. 3. Small hiatal hernia. 4. Post CABG. Abdomen / Pelvis Impression: 1. Large sacral and parasacral mass as described on comparison MRI. 2. No additional skeletal lesions are identified. 3. No evidence of metastatic adenopathy in the abdomen pelvis. 4. In patient with lymphoma, FDG PET scan and may be appropriate for pre therapy staging and baseline for follow-up of treatment. Electronically Signed   By: SSuzy BouchardM.D.   On: 02/02/2021 16:47   NM PET Image Initial (PI) Skull Base To Thigh  Result Date: 02/16/2021 CLINICAL DATA:  Subsequent treatment strategy for sacral mass, lymphoma. EXAM: NUCLEAR MEDICINE PET SKULL BASE  TO THIGH TECHNIQUE: 9.1 mCi F-18 FDG was injected intravenously. Full-ring PET imaging was performed from the skull base to thigh after the radiotracer. CT data was obtained and used for attenuation correction and anatomic localization. Fasting blood glucose: 139 mg/dl COMPARISON:  02/08/2021 and CT chest abdomen pelvis 02/02/2021. FINDINGS: Mediastinal blood pool activity: SUV max 1.5 Liver activity: SUV max 2.8 NECK: No hypermetabolic lymph nodes. Incidental CT findings: None. CHEST: No abnormal hypermetabolism. Incidental CT findings: Atherosclerotic calcification of the aorta and aortic valve. Ascending aorta measures up to 4.4 cm, stable. No pericardial or pleural effusion. Moderate hiatal hernia. ABDOMEN/PELVIS: No abnormal hypermetabolism in the liver, adrenal glands, spleen or pancreas. No hypermetabolic lymph nodes. Incidental CT findings: Liver, gallbladder, adrenal glands, kidneys, spleen and pancreas are unremarkable. Moderate hiatal hernia. Duodenal diverticulum. Small bowel, appendix and colon are  otherwise unremarkable. Atherosclerotic calcification of the aorta. SKELETON: An expansile lytic mass in the right sacrum extends into the right sciatic region with an SUV max 14.2 superiorly and 17.1 inferiorly. Overall size appears grossly stable in the very short interval from 02/08/2021. No additional abnormal osseous hypermetabolism. Incidental CT findings: Right hip arthroplasty.  Degenerative changes in the spine. IMPRESSION: 1. Hypermetabolic lytic right sacral mass with extension into the right sciatic region, stable in size from 02/08/2021 and compatible with the provided history of lymphoma. No evidence of distant disease. 2. Ascending aortic aneurysm as before. Recommend annual imaging followup by CTA or MRA. This recommendation follows 2010 ACCF/AHA/AATS/ACR/ASA/SCA/SCAI/SIR/STS/SVM Guidelines for the Diagnosis and Management of Patients with Thoracic Aortic Disease. Circulation. 2010; 121: Y814-G818. Aortic aneurysm NOS (ICD10-I71.9) 3.  Aortic atherosclerosis (ICD10-I70.0). Electronically Signed   By: Lorin Picket M.D.   On: 02/16/2021 15:39   NM PET Image Initial (PI) Skull Base To Thigh  Result Date: 02/09/2021 CLINICAL DATA:  Initial treatment strategy for large B-cell lymphoma. EXAM: NUCLEAR MEDICINE PET SKULL BASE TO THIGH TECHNIQUE: 9.3 mCi F-18 FDG was injected intravenously. Full-ring PET imaging was performed from the skull base to thigh after the radiotracer. CT data was obtained and used for attenuation correction and anatomic localization. Fasting blood glucose: 94 mg/dl COMPARISON:  Multiple exams, including CT scan 02/03/2020 FINDINGS: Mediastinal blood pool activity: SUV max 2.3 (taken from the abdominal aorta) Liver activity: SUV max 3.4 Unfortunately due to discomfort the patient terminated the PET portion of today's examination with acquisition of only the proximal thighs through the kidneys, but not including vertical levels above the kidneys such as the upper portion of the  upper abdomen, chest, and neck. Accordingly we have CT data but no PET data for those regions. The image region does include the only area of known definite tumor based on prior imaging, which is in the right hemipelvis region. NECK: No PET data available Incidental CT findings: Bilateral common carotid atherosclerotic calcifications. CHEST: No PET data available Incidental CT findings: Coronary, aortic arch, and branch vessel atherosclerotic vascular disease. Prior CABG. 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 Moderate-sized hiatal hernia. Right axillary clips noted. No pathologic adenopathy identified. Mild biapical pleuroparenchymal scarring. Mild scarring in the posterior basal segment left lower lobe. ABDOMEN/PELVIS: There is no PET data about the level of the kidneys, for example involving the spleen, and upper half of the liver. Right pelvic mass eroding the posterior margin of the right sacrum and medial margin of the right iliac bone noted in the right posterior paraspinal musculature, extending along the right S1 and S2 foramina and penetrating specially through the S2 foramen  into the right presacral space, with some local mass effect on the adjacent sciatic nerve. The upper posterior paraspinal segment of this continuous mass has a maximum SUV of 20.2 (Deauville 5), and the presacral portion extending through the right sciatic notch along the piriformis muscle has maximum SUV of 22.4 (Deauville 5). Morphologically the mass appears similar to on recent CT examination of 02/02/2021, although the mass does not substantially cross midline and accordingly the measurement for the posterior paraspinal component is a little exaggerated on that exam. Currently the hypermetabolic activity associated with the posterior paraspinal component measures about 4.7 by 4.9 by 8.1 cm, and the oval-shaped component tracking along the piriformis muscle measures about 8.0 by 4.2 by 3.4 cm. No separate  hypermetabolic lesion is identified. Incidental CT findings: Atherosclerosis is present, including aortoiliac atherosclerotic disease. Sigmoid colon diverticulosis. Borderline prostatomegaly. SKELETON: Bony erosions of the right posterior sacrum and medial right iliac bone due to the posterior paraspinal component of the pelvic mass identified. No other hypermetabolic activity is identified from the L1-2 level down. Level cephalad to the L1-2 level do not have associated PET data. Incidental CT findings: Degenerative glenohumeral arthropathy bilaterally. Lumbar spondylosis and degenerative disc disease. IMPRESSION: 1. Unfortunately due to discomfort the patient terminated the PET portion of today's examination with acquisition of only the proximal thighs through the kidneys, but not including vertical levels above the kidneys such as the upper portion of the upper abdomen, the chest, and the neck. There is no known involvement in the region omitted on the PET data based on prior imaging. 2. The known mass of the right posterior paraspinal musculature along the upper sacrum tracking through the S1 and S2 neural foramina into the right piriformis muscle and subsequently out through the sciatic notch is Deauville 5 with measurements as noted above. This erodes the posterior sacrum and part of the adjacent iliac bone. 3. 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 Recommend annual imaging followup by CTA or MRA. This recommendation follows 2010 ACCF/AHA/AATS/ACR/ASA/SCA/SCAI/SIR/STS/SVM Guidelines for the Diagnosis and Management of Patients with Thoracic Aortic Disease. Circulation. 2010; 121: [id number]. Aortic aneurysm NOS (ICD10-I71.9) 4. Other imaging findings of potential clinical significance: Aortic Atherosclerosis (ICD10-I70.0). Coronary atherosclerosis with prior CABG. Moderate-sized hiatal hernia. Sigmoid colon diverticulosis. Borderline prostatomegaly. Lumbar spondylosis and degenerative disc  disease. Electronically Signed   By: Van Clines M.D.   On: 02/09/2021 12:03   DG Chest Portable 1 View  Result Date: 02/19/2021 CLINICAL DATA:  Evaluate for infiltrate. EXAM: PORTABLE CHEST 1 VIEW COMPARISON:  CT dated 02/02/2021. FINDINGS: Right-sided Port-A-Cath with tip over central SVC. Minimal bibasilar atelectasis. No focal consolidation, pleural effusion, pneumothorax. The cardiac silhouette is within limits. Atherosclerotic calcification of the aorta. Median sternotomy wires and CABG vascular clips. No acute osseous pathology. IMPRESSION: No active disease. Electronically Signed   By: Anner Crete M.D.   On: 02/19/2021 21:43   CT BONE MARROW BIOPSY  Result Date: 02/09/2021 INDICATION: B-cell lymphoma EXAM: CT BIOPSY BONE MARROW MEDICATIONS: None. ANESTHESIA/SEDATION: Moderate (conscious) sedation was employed during this procedure. A total of Versed 1 mg and Fentanyl 25 mcg was administered intravenously. Moderate Sedation Time: 15 minutes. The patient's level of consciousness and vital signs were monitored continuously by radiology nursing throughout the procedure under my direct supervision. FLUOROSCOPY TIME:  N/a COMPLICATIONS: None immediate. PROCEDURE: Informed written consent was obtained from the patient after a thorough discussion of the procedural risks, benefits and alternatives. All questions were addressed. Maximal Sterile Barrier Technique was utilized  including caps, mask, sterile gowns, sterile gloves, sterile drape, hand hygiene and skin antiseptic. A timeout was performed prior to the initiation of the procedure. The patient was placed prone on the CT exam table. Limited CT of the pelvis was performed for planning purposes. Skin entry site was marked, and the overlying skin was prepped and draped in the standard sterile fashion. Local analgesia was obtained with 1% lidocaine. Using CT guidance, an 11 gauge needle was advanced just deep to the cortex of the right posterior  ilium. Subsequently, bone marrow aspiration and core biopsy were performed. Specimens were submitted to lab/pathology for handling. Hemostasis was achieved with manual pressure, and a clean dressing was placed. The patient tolerated the procedure well without immediate complication. IMPRESSION: Successful CT-guided bone marrow aspiration and core biopsy of the right posterior ilium. Electronically Signed   By: Albin Felling M.D.   On: 02/09/2021 10:33   ECHOCARDIOGRAM COMPLETE  Result Date: 02/15/2021    ECHOCARDIOGRAM REPORT   Patient Name:   QUAID YEAKLE Date of Exam: 02/15/2021 Medical Rec #:  [id number]      Height:       69.0 in Accession #:    [id number]     Weight:       163.0 lb Date of Birth:  1946/01/09       BSA:          1.894 m Patient Age:    26 years       BP:           163/84 mmHg Patient Gender: M              HR:           82 bpm. Exam Location:  [facility] Procedure: 2D Echo, Cardiac Doppler and Color Doppler Indications:     Chemo [id number]  History:         Patient has no prior history of Echocardiogram examinations.                  Previous Myocardial Infarction and CAD, Prior CABG; Risk                  Factors:Hypertension and Dyslipidemia.  Sonographer:     Sherrie Sport Referring Phys:  [id number] Jordan Mason Diagnosing Phys: Nelva Bush MD  Sonographer Comments: Suboptimal apical window and suboptimal subcostal window. IMPRESSIONS  1. Left ventricular ejection fraction, by estimation, is 60 to 65%. The left ventricle has normal function. The left ventricle has no regional wall motion abnormalities. The left ventricular internal cavity size was mildly dilated. Left ventricular diastolic parameters are consistent with Grade I diastolic dysfunction (impaired relaxation).  2. Right ventricular systolic function is mildly reduced. The right ventricular size is normal. Mildly increased right ventricular wall thickness.  3. The mitral valve is grossly normal. No evidence of mitral valve regurgitation.  No evidence of mitral stenosis.  4. The aortic valve is tricuspid. There is mild calcification of the aortic valve. There is mild thickening of the aortic valve. Aortic valve regurgitation is mild.  5. Aortic dilatation noted. 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 FINDINGS  Left Ventricle: Left ventricular ejection fraction, by estimation, is 60 to 65%. The left ventricle has normal function. The left ventricle has no regional wall motion abnormalities. The left ventricular internal cavity size was mildly dilated. There is  no left ventricular hypertrophy. Abnormal (paradoxical) septal motion consistent with post-operative status. Left ventricular diastolic  parameters are consistent with Grade I diastolic dysfunction (impaired relaxation). Right Ventricle: The right ventricular size is normal. Mildly increased right ventricular wall thickness. Right ventricular systolic function is mildly reduced. Left Atrium: Left atrial size was normal in size. Right Atrium: Right atrial size was normal in size. Pericardium: There is no evidence of pericardial effusion. Mitral Valve: The mitral valve is grossly normal. No evidence of mitral valve regurgitation. No evidence of mitral valve stenosis. MV peak gradient, 2.4 mmHg. The mean mitral valve gradient is 1.0 mmHg. Tricuspid Valve: The tricuspid valve is not well visualized. Tricuspid valve regurgitation is mild. Aortic Valve: The aortic valve is tricuspid. There is mild calcification of the aortic valve. There is mild thickening of the aortic valve. Aortic valve regurgitation is mild. Aortic valve mean gradient measures 2.0 mmHg. Aortic valve peak gradient measures 3.6 mmHg. Aortic valve area, by VTI measures 4.86 cm. Pulmonic Valve: The pulmonic valve was not well visualized. Pulmonic valve regurgitation is not visualized. No evidence of pulmonic stenosis. Aorta: Aortic dilatation noted. 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  Pulmonary Artery: The pulmonary artery is of normal size. IAS/Shunts: The interatrial septum was not well visualized.  LEFT VENTRICLE PLAX 2D LVIDd:         3.70 cm   Diastology LVIDs:         2.20 cm   LV e' medial:    5.77 cm/s LV PW:         1.30 cm   LV E/e' medial:  8.7 LV IVS:        1.00 cm   LV e' lateral:   14.30 cm/s LVOT diam:     2.20 cm   LV E/e' lateral: 3.5 LV SV:         69 LV SV Index:   36 LVOT Area:     3.80 cm  RIGHT VENTRICLE RV Basal diam:  4.00 cm RV S prime:     9.79 cm/s TAPSE (M-mode): 1.2 cm LEFT ATRIUM             Index        RIGHT ATRIUM           Index LA diam:        1.90 cm 1.00 cm/m   RA Area:     12.90 cm LA Vol (A2C):   29.4 ml 15.52 ml/m  RA Volume:   30.70 ml  16.21 ml/m LA Vol (A4C):   15.3 ml 8.08 ml/m LA Biplane Vol: 22.2 ml 11.72 ml/m  AORTIC VALVE                    PULMONIC VALVE AV Area (Vmax):    3.75 cm     PV Vmax:        0.67 m/s AV Area (Vmean):   4.06 cm     PV Vmean:       45.400 cm/s AV Area (VTI):     4.86 cm     PV VTI:         0.097 m AV Vmax:           95.40 cm/s   PV Peak grad:   1.8 mmHg AV Vmean:          65.700 cm/s  PV Mean grad:   1.0 mmHg AV VTI:            0.142 m      RVOT Peak grad:  2 mmHg AV Peak Grad:      3.6 mmHg AV Mean Grad:      2.0 mmHg LVOT Vmax:         94.00 cm/s LVOT Vmean:        70.200 cm/s LVOT VTI:          0.181 m LVOT/AV VTI ratio: 1.28  AORTA Ao Root diam: 4.10 cm MITRAL VALVE               TRICUSPID VALVE MV Area (PHT): 2.43 cm    TR Peak grad:   14.6 mmHg MV Area VTI:   3.98 cm    TR Vmax:        191.00 cm/s MV Peak grad:  2.4 mmHg MV Mean grad:  1.0 mmHg    SHUNTS MV Vmax:       0.78 m/s    Systemic VTI:  0.18 m MV Vmean:      40.9 cm/s   Systemic Diam: 2.20 cm MV Decel Time: 312 msec    Pulmonic VTI:  0.081 m MV E velocity: 50.10 cm/s MV A velocity: 56.10 cm/s MV E/A ratio:  0.89 Christopher End MD Electronically signed by Nelva Bush MD Signature Date/Time: 02/15/2021/9:47:17 AM    Final    IR IMAGING GUIDED  PORT INSERTION  Result Date: 02/11/2021 INDICATION: b cell lymphoma EXAM: 1. Ultrasound-guided puncture of the right internal jugular vein 2. Placement of a right-sided chest port using fluoroscopic guidance MEDICATIONS: None ANESTHESIA/SEDATION: Moderate (conscious) sedation was employed during this procedure. A total of Versed 2 mg and Fentanyl 100 mcg was administered intravenously. Moderate Sedation Time: 26 minutes. The patient's level of consciousness and vital signs were monitored continuously by radiology nursing throughout the procedure under my direct supervision. FLUOROSCOPY TIME:  Fluoroscopy Time: 0.7 minutes with 3 exposures COMPLICATIONS: None immediate. PROCEDURE: Informed written consent was obtained from the patient after a thorough discussion of the procedural risks, benefits and alternatives. All questions were addressed. Maximal Sterile Barrier Technique was utilized including caps, mask, sterile gowns, sterile gloves, sterile drape, hand hygiene and skin antiseptic. A timeout was performed prior to the initiation of the procedure. The patient was placed supine on the exam table. The right neck and chest was prepped and draped in the standard sterile fashion. A preliminary ultrasound of the right neck was performed and demonstrates a patent right internal jugular vein. A permanent ultrasound image was stored in the electronic medical record. The overlying skin was anesthetized with 1% Lidocaine. Using ultrasound guidance, access was obtained into the right internal jugular vein using a 21 gauge micropuncture set. A wire was advanced into the SVC, a short incision was made at the puncture site, and serial dilatation performed. Next, in an ipsilateral infraclavicular location, an incision was made at the site of the subcutaneous reservoir. Blunt dissection was used to open a pocket to contain the reservoir. A subcutaneous tunnel was then created from the port site to the puncture site. A(n) 8  Fr single lumen catheter was advanced through the tunnel. The catheter was attached to the port and this was placed in the subcutaneous pocket. Under fluoroscopic guidance, a peel away sheath was placed, and the catheter was trimmed to the appropriate length and was advanced into the central veins. The catheter length is 26 cm. The tip of the catheter lies near the superior cavoatrial junction. The port flushes and aspirates appropriately. The port was flushed and locked with heparinized saline. The port pocket was  closed in 2 layers using 3-0 and 4-0 Vicryl/absorbable suture. Dermabond was also applied to both incisions. The patient tolerated the procedure well and was transferred to recovery in stable condition. IMPRESSION: Successful placement of a right chest port via the right internal jugular vein. The port is ready for immediate use. Electronically Signed   By: Albin Felling M.D.   On: 02/11/2021 12:02   DG FLUORO GUIDED LOC OF NEEDLE/CATH TIP FOR SPINAL INJECT LT  Result Date: 02/04/2021 CLINICAL DATA:  Patient with B-cell lymphoma request received for diagnostic lumbar puncture. EXAM: DIAGNOSTIC LUMBAR PUNCTURE UNDER FLUOROSCOPIC GUIDANCE COMPARISON:  MR lumbar spine performed 01/12/2021 reviewed prior to the procedure. FLUOROSCOPY TIME:  Fluoroscopy Time: 0.8 Minute Radiation Exposure Index (if provided by the fluoroscopic device): 17.80 mGy Number of Acquired Spot Images: 3 PROCEDURE: Informed consent was obtained from the patient/patient's legal guardian prior to the procedure, including potential complications of bleeding, infection, paresthesias, nerve damage, CSF leak requiring additional procedures, post procedure requirement to lay flat for several hours after the procedure, headache, allergy, and pain. With the patient prone, the lower back was prepped with Betadine. 1% Lidocaine was used for local anesthesia. Attempted lumbar puncture was performed at the L2-L3 however unable to get fluid  back, inner stylet of the needle was replaced and the needle was removed in it's entirety. Lumbar puncture was then performed at the L1-L2 level using a 20 gauge needle with return of slight blood-tinged at first which turned to colorless CSF. 15 ml of CSF were obtained for laboratory studies. The inner stylet was placed back into the needle and the needle was removed in its entirety. The patient tolerated the procedure well and there were no apparent complications. A sterile bandage was applied. IMPRESSION: Technically successful fluoroscopic guided lumbar puncture. This exam was performed by Tsosie Billing PA-C, and was supervised and interpreted by Dr. Jearld Lesch. Electronically Signed   By: Kathreen Devoid M.D.   On: 02/04/2021 11:18    PERFORMANCE STATUS (ECOG) : 1 - Symptomatic but completely ambulatory  Review of Systems Unless otherwise noted, a complete review of systems is negative.  Physical Exam General: NAD Cardiovascular: regular rate and rhythm Pulmonary: clear ant fields Abdomen: soft, nontender, + bowel sounds GU: no suprapubic tenderness Extremities: no edema, no joint deformities Skin: no rashes Neurological: Grossly nonfocal   Assessment and Plan- Patient is a 76 y.o. 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  Patient has had severe low back/right hip pain.  He presented to [facility] for evaluation management of worsening pain.   Neoplastic pain -discussed options with patient including liberalizing Norco, rotating to another short acting opioid, and/or starting him on a long-acting opioid.  It is hopeful that pain will significantly improve once patient starts chemotherapy later this week.  Therefore, we will hold off on starting him on a long-acting opioid for now.  We will proceed with  rotating him to oxycodone 5 to 10 mg every 4 hours as needed #60.  We will give him a dose of oxycodone here in the clinic.  Case and plan discussed with Dr. Grayland Ormond   Patient expressed understanding and was in agreement with this plan. He also understands that He can call clinic at any time with any questions, concerns, or complaints.   Thank you for allowing me  to participate in the care of this very pleasant patient.   Time Total: 15 minutes  Visit consisted of counseling and education dealing with the complex and emotionally intense issues of symptom management in the setting of serious illness.Greater than 50%  of this time was spent counseling and coordinating care related to the above assessment and plan.  Signed by: Altha Harm, PhD, NP-C

## 2021-03-01 NOTE — Progress Notes (Signed)
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  INTERVAL HISTORY: Patient returns to clinic today for further evaluation and initiation of cycle 1 of R-CHOP.  He continues to have pain, but is better controlled and does not wish to change his narcotic regimen at this time.  He otherwise feels well. He denies any night sweats, fevers, or weight loss.  He has no neurologic complaints.  He has no chest pain, shortness of breath, cough, or hemoptysis.  He denies any nausea, vomiting, constipation, or diarrhea.  He has no urinary complaints.  Patient offers no further specific complaints today.    REVIEW OF SYSTEMS:   Review of Systems  Constitutional: Negative.  Negative for fever, malaise/fatigue and weight loss.  Respiratory: Negative.  Negative for cough, hemoptysis and shortness of breath.   Cardiovascular: Negative.  Negative for chest pain and leg swelling.  Gastrointestinal: Negative.  Negative for abdominal pain.  Genitourinary: Negative.  Negative for dysuria.  Musculoskeletal:  Positive for back pain.  Skin: Negative.  Negative for rash.  Neurological: Negative.  Negative for dizziness, focal weakness, weakness and headaches.  Psychiatric/Behavioral: Negative.  The patient is not nervous/anxious.    As per HPI. Otherwise, a complete review of systems is negative.  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    a.)  s/p RIGHT sided PTA and stenting   MDD (major depressive disorder)    Myocardial infarction ([facility]) 02/21/2006   Neuropathy    OSA (obstructive sleep apnea)    Peripheral vascular disease (HCC)    Postlaminectomy syndrome of lumbar region 09/21/2011   Postoperative atrial fibrillation (HCC)    Statin intolerance    Thoracic aortic aneurysm    s/p dissection and repair in 08/2012    PAST SURGICAL HISTORY: Past Surgical History:  Procedure Laterality Date   ANGIOPLASTY / STENTING ILIAC Right 04/03/2006   Procedure(s): abdominal aortography, bilateral lower extremity runoff, iliac PTA and stenting; Location: [facility]; Surgeon: Dionne Ano, MD   BLEPHAROPLASTY Bilateral 05/06/2019   Procedure: BLEPHAROPLASTY, UPPER EYELID; WITH EXCESSIVE SKIN WEIGHTING DOWN LID; Surgeon: Lucilla Lame, MD; Location: [place]; Service: Ophthalmology; Laterality: Bilateral   CARDIAC CATHETERIZATION N/A 03/15/2006   Location: [facility]; Surgeon: Samella Parr, MD   CARDIAC CATHETERIZATION N/A 03/23/2011   Location: [facility]; Surgeon: Kathe Mariner, MD   CATARACT EXTRACTION W/ INTRAOCULAR LENS IMPLANT Right 03/04/2019   Procedure: EXTRACAPSULAR CATARACT PHACO REMOVAL WITH INSERTION OF INTRAOCULAR LENS PROSTHESIS (1 STAGE PROCEDURE), MANUAL OR MECHANICAL TECHNIQUE; Surgeon: Christean Grief, MD; Location: [facility]; Service: Ophthalmology; Laterality: Right; incision approximated   CATARACT EXTRACTION W/ INTRAOCULAR LENS IMPLANT Left 02/18/2019   Procedure: DEXTENZA- EXTRACAPSULAR CATARACT ECCE REMOVAL WITH INSERTION OF INTRAOCULAR LENS PROSTHESIS (1 STAGE PROCEDURE), MANUAL OR MECHANICAL TECHNIQUE; Surgeon: Christean Grief, MD; Location: [facility]; Service: Ophthalmology; Laterality: Left   COLONOSCOPY  CORONARY ARTERY BYPASS GRAFT N/A 03/29/2011   4v; LIMA-LAD, SVG-RI, SVG-D1, SVG-RCA   ELECTROCONVULSIVE THERAPY N/A    Multiple treatments   FRACTURE SURGERY     IR IMAGING GUIDED PORT INSERTION   02/11/2021   LUMBAR LAMINECTOMY N/A 02/2004   L4-L5 and L5-S1   PERCUTANEOUS CORONARY STENT INTERVENTION (PCI-S) N/A 02/21/2006   Procedure: PCI with 3.5 x 16 mm Liberte BMS x 1 to RCA; Location: [facility]; Surgeon: Dionne Ano, MD   PERCUTANEOUS CORONARY STENT INTERVENTION (PCI-S) N/A 09/18/2006   Procedure: overlapping 3.5 x 18 mm Xience DES to in stent of BMS in RCA; Location: [facility]; Surgeon: Lovena Neighbours, MD   REPAIR OF ACUTE ASCENDING THORACIC AORTIC DISSECTION N/A 08/14/2012   Redo sternotomy for repair Type A dissection with ascending aortic graft, aortic valve resuspension, aortic valve central plication of non-coronary cusp, STJ remodeling; Location: [facility]; Surgeon: Dr. Cheree Ditto   REVISION TOTAL KNEE ARTHROPLASTY Right 11/2009   TONSILLECTOMY     TOTAL HIP ARTHROPLASTY Right 08/24/2020   Procedure: TOTAL HIP ARTHROPLASTY ANTERIOR APPROACH;  Surgeon: Hessie Knows, MD;  Location: [facility];  Service: Orthopedics;  Laterality: Right;   TOTAL KNEE ARTHROPLASTY Right 02/2008    FAMILY HISTORY: No family history on file.  ADVANCED DIRECTIVES (Y/N):  N  HEALTH MAINTENANCE: Social History   Tobacco Use   Smoking status: Former    Types: Cigars    Quit date: 03/19/2011    Years since quitting: 9.9   Smokeless tobacco: Never  Vaping Use   Vaping Use: Never used  Substance Use Topics   Alcohol use: Not Currently   Drug use: Never     Colonoscopy:  PAP:  Bone density:  Lipid panel:  Allergies  Allergen Reactions   Vortioxetine Other (See Comments)    Serotonin syndrome     Atorvastatin Other (See Comments)     Muscle Pain   Bupropion Other (See Comments)    vision problem     Morphine Nausea Only and Other (See Comments)    "dreams"    Pravastatin Other (See Comments)     Muscle Pain    Rosuvastatin Other (See Comments)    Muscle Pain   Felodipine Other (See Comments)     sexual dysfunction     Hydrochlorothiazide W-Triamterene Other (See Comments)    leg  cramps     Lisinopril Other (See Comments)     jitteriness     Metoprolol Other (See Comments)    sexual dys, abn tongue sens.      Current Outpatient Medications  Medication Sig Dispense Refill   allopurinol (ZYLOPRIM) 300 MG tablet Take 1 tablet (300 mg total) by mouth daily. 30 tablet 3   amphetamine-dextroamphetamine (ADDERALL) 20 MG tablet Take 20 mg by mouth 2 (two) times daily.     benazepril (LOTENSIN) 20 MG tablet Take 20 mg by mouth at bedtime.     celecoxib (CELEBREX) 200 MG capsule Take 1 capsule by mouth 2 (two) times daily.     cetirizine (ZYRTEC) 10 MG tablet Take 10 mg by mouth daily as needed for allergies.     Cholecalciferol (VITAMIN D3 SUPER STRENGTH) 50 MCG (2000 UT) TABS Take 2,000 Units by mouth daily.     dexamethasone (DECADRON) 4 MG tablet Take 1 tablet (4 mg total) by mouth daily. 30 tablet 3   escitalopram (LEXAPRO) 20 MG tablet Take 20 mg by mouth at bedtime.     fluticasone (FLONASE) 50 MCG/ACT nasal spray Place 2 sprays into  the nose daily.     ibuprofen (ADVIL) 200 MG tablet Take 800 mg by mouth daily as needed.     LATUDA 40 MG TABS tablet Take 40 mg by mouth at bedtime.     lidocaine-prilocaine (EMLA) cream Apply to affected area once 30 g 3   Multiple Vitamins-Minerals (MULTIVITAMIN WITH MINERALS) tablet Take 1 tablet by mouth daily.     ondansetron (ZOFRAN) 8 MG tablet Take 1 tablet (8 mg total) by mouth 2 (two) times daily as needed for refractory nausea / vomiting. 60 tablet 2   oxyCODONE (OXY IR/ROXICODONE) 5 MG immediate release tablet Take 1-2 tablets (5-10 mg total) by mouth every 4 (four) hours as needed for severe pain. 60 tablet 0   pantoprazole (PROTONIX) 40 MG tablet Take 40 mg by mouth daily.     polyethylene glycol (MIRALAX / GLYCOLAX) 17 g packet Take 17 g by mouth daily. 14 each 1   predniSONE (DELTASONE) 20 MG tablet Take 5 tablets (100 mg total) by mouth daily. Take with food on days 1-5 of chemotherapy. 25 tablet 5    prochlorperazine (COMPAZINE) 10 MG tablet Take 1 tablet (10 mg total) by mouth every 6 (six) hours as needed (Nausea or vomiting). 60 tablet 2   SYMBICORT 80-4.5 MCG/ACT inhaler Inhale 2 puffs into the lungs 2 (two) times daily.     testosterone cypionate (DEPOTESTOSTERONE CYPIONATE) 200 MG/ML injection Inject 200 mg into the muscle every 14 (fourteen) days.     TIADYLT ER 360 MG 24 hr capsule Take 360 mg by mouth at bedtime.     VASCEPA 1 g capsule Take 2 g by mouth 2 (two) times daily.     AUSTEDO 9 MG TABS Take 9 mg by mouth 2 (two) times daily. (Patient not taking: Reported on 02/20/2021)     traMADol (ULTRAM) 50 MG tablet Take 1 tablet (50 mg total) by mouth every 6 (six) hours. (Patient not taking: Reported on 02/22/2021) 30 tablet 0   No current facility-administered medications for this visit.   Facility-Administered Medications Ordered in Other Visits  Medication Dose Route Frequency Provider Last Rate Last Admin   cyclophosphamide (CYTOXAN) 1,400 mg in sodium chloride 0.9 % 250 mL chemo infusion  750 mg/m2 (Treatment Plan Recorded) Intravenous Once Lloyd Huger, MD       DOXOrubicin (ADRIAMYCIN) chemo injection 94 mg  50 mg/m2 (Treatment Plan Recorded) Intravenous Once Lloyd Huger, MD       heparin lock flush 100 unit/mL  500 Units Intracatheter Once PRN Lloyd Huger, MD       riTUXimab-pvvr (RUXIENCE) 700 mg in sodium chloride 0.9 % 250 mL (2.1875 mg/mL) infusion  375 mg/m2 (Treatment Plan Recorded) Intravenous Once Lloyd Huger, MD       vinCRIStine (ONCOVIN) 2 mg in sodium chloride 0.9 % 50 mL chemo infusion  2 mg Intravenous Once Lloyd Huger, MD        OBJECTIVE: Vitals:   03/03/21 0829  BP: 128/68  Pulse: 75  Resp: 16  Temp: 97.9 F (36.6 C)  SpO2: 98%     Body mass index is 23.2 kg/m.    ECOG FS:0 - Asymptomatic  General: Well-developed, well-nourished, no acute distress. Eyes: Pink conjunctiva, anicteric sclera. HEENT: Normocephalic,  moist mucous membranes. Lungs: No audible wheezing or coughing. Heart: Regular rate and rhythm. Abdomen: Soft, nontender, no obvious distention. Musculoskeletal: No edema, cyanosis, or clubbing. Neuro: Alert, answering all questions appropriately. Cranial nerves grossly intact. Skin: No  rashes or petechiae noted. Psych: Normal affect.  LAB RESULTS:  Lab Results  Component Value Date   NA 132 (L) 03/03/2021   K 4.2 03/03/2021   CL 100 03/03/2021   CO2 21 (L) 03/03/2021   GLUCOSE 152 (H) 03/03/2021   BUN 33 (H) 03/03/2021   CREATININE 1.07 03/03/2021   CALCIUM 9.0 03/03/2021   PROT 6.3 (L) 03/03/2021   ALBUMIN 3.4 (L) 03/03/2021   AST 28 03/03/2021   ALT 19 03/03/2021   ALKPHOS 56 03/03/2021   BILITOT 0.3 03/03/2021   GFRNONAA >60 03/03/2021    Lab Results  Component Value Date   WBC 21.8 (H) 03/03/2021   NEUTROABS 17.6 (H) 03/03/2021   HGB 14.8 03/03/2021   HCT 43.5 03/03/2021   MCV 91.0 03/03/2021   PLT 445 (H) 03/03/2021     STUDIES: CT ABDOMEN PELVIS W CONTRAST  Result Date: 02/19/2021 CLINICAL DATA:  Nausea and vomiting. Diarrhea and decreased appetite. EXAM: CT ABDOMEN AND PELVIS WITH CONTRAST TECHNIQUE: Multidetector CT imaging of the abdomen and pelvis was performed using the standard protocol following bolus administration of intravenous contrast. CONTRAST:  125m OMNIPAQUE IOHEXOL 300 MG/ML  SOLN COMPARISON:  PET CT 3 days ago.  Chest abdomen pelvis CT 02/02/2021 FINDINGS: Lower chest: Subsegmental atelectasis or scar in the right lower lobe. No pleural effusion or acute airspace disease. Unchanged hiatal hernia. Hepatobiliary: Focal fatty infiltration adjacent to the falciform ligament. No focal liver lesion. Mild gallbladder distention without calcified gallstone or pericholecystic fat stranding. No biliary dilatation. Pancreas: No ductal dilatation or inflammation. Spleen: Normal in size without focal abnormality. Adrenals/Urinary Tract: Normal adrenal glands.  Lobulated bilateral renal contours without hydronephrosis. No perinephric edema. Homogeneous renal enhancement with symmetric excretion on delayed phase imaging. No focal renal abnormality or visualized stone. Unremarkable urinary bladder. Stomach/Bowel: Moderate hiatal hernia. Small duodenal diverticulum. No small bowel obstruction or abnormal distention. Occasional fluid-filled distal small bowel without perienteric fat stranding. High-riding cecum in the right mid abdomen. The appendix is normal. Moderate volume of stool throughout the colon. Colonic diverticulosis. No diverticulitis or acute colonic inflammation. Vascular/Lymphatic: Moderately advanced aortic atherosclerosis. Bi-iliac tortuosity. Patent portal and splenic veins. No acute vascular findings. No enlarged abdominopelvic lymph nodes. Reproductive: Unremarkable prostate gland. Other: No ascites, free air or focal fluid collection. Tiny fat containing umbilical hernia. No omental thickening. Musculoskeletal: Expansile right sacral lesion which has been recently assessed with PET. Ghost tracks within L5-S1. Diffuse lumbar degenerative change. Right hip arthroplasty IMPRESSION: 1. No acute abnormality in the abdomen/pelvis. 2. Moderate volume of stool throughout the colon, can be seen with constipation. Colonic diverticulosis without diverticulitis. 3. Moderate hiatal hernia. 4. Known right sacral lesion is unchanged in the short interim from prior imaging. Aortic Atherosclerosis (ICD10-I70.0). Electronically Signed   By: MKeith RakeM.D.   On: 02/19/2021 22:19   CT CHEST ABDOMEN PELVIS W CONTRAST  Result Date: 02/02/2021 CLINICAL DATA:  LEFT sacral mass.  Large B-cell lymphoma on biopsy. EXAM: CT CHEST, ABDOMEN, AND PELVIS WITH CONTRAST TECHNIQUE: Multidetector CT imaging of the chest, abdomen and pelvis was performed following the standard protocol during bolus administration of intravenous contrast. CONTRAST:  1067mOMNIPAQUE IOHEXOL 300  MG/ML  SOLN COMPARISON:  Biopsy procedural CT 01/24/2021, lumbar spine MRI 01/12/2021 FINDINGS: CT CHEST FINDINGS Cardiovascular: Post CABG. No acute cardiac findings vascular findings. Mediastinum/Nodes: No axillary or supraclavicular adenopathy. No mediastinal or hilar adenopathy. No pericardial fluid. Esophagus normal. Moderate size sliding-type hiatal hernia. Lungs/Pleura: No suspicious pulmonary nodules. Nodule pleural parenchymal  thickening in the posterior upper lobes is favored benign. Airways normal Musculoskeletal: No aggressive osseous lesion of thorax. Midline sternotomy. CT ABDOMEN AND PELVIS FINDINGS Hepatobiliary: No focal hepatic lesion. No biliary ductal dilatation. Gallbladder is normal. Common bile duct is normal. Pancreas: Pancreas is normal. No ductal dilatation. No pancreatic inflammation. Spleen: Normal spleen Adrenals/urinary tract: Adrenal glands and kidneys are normal. The ureters and bladder normal. Stomach/Bowel: Stomach, small bowel, appendix, and cecum are normal. Multiple diverticula of the descending colon and sigmoid colon without acute inflammation. Vascular/Lymphatic: Abdominal aorta is normal caliber with atherosclerotic calcification. There is no retroperitoneal or periportal lymphadenopathy. No pelvic lymphadenopathy. Reproductive: Unremarkable Other: No peritoneal disease Musculoskeletal: Parasacral mass with erosion into the posterior elements of the L5-S1 vertebral body measures 8.1 by 4.0 cm (image 91/2. No additional skeletal lesions are identified Post RIGHT hip arthroplasty. IMPRESSION: Chest Impression: 1. No evidence of thoracic metastasis. 2. No evidence of skeletal metastasis in the thorax. 3. Small hiatal hernia. 4. Post CABG. Abdomen / Pelvis Impression: 1. Large sacral and parasacral mass as described on comparison MRI. 2. No additional skeletal lesions are identified. 3. No evidence of metastatic adenopathy in the abdomen pelvis. 4. In patient with lymphoma, FDG  PET scan and may be appropriate for pre therapy staging and baseline for follow-up of treatment. Electronically Signed   By: Suzy Bouchard M.D.   On: 02/02/2021 16:47   NM PET Image Initial (PI) Skull Base To Thigh  Result Date: 02/16/2021 CLINICAL DATA:  Subsequent treatment strategy for sacral mass, lymphoma. EXAM: NUCLEAR MEDICINE PET SKULL BASE TO THIGH TECHNIQUE: 9.1 mCi F-18 FDG was injected intravenously. Full-ring PET imaging was performed from the skull base to thigh after the radiotracer. CT data was obtained and used for attenuation correction and anatomic localization. Fasting blood glucose: 139 mg/dl COMPARISON:  02/08/2021 and CT chest abdomen pelvis 02/02/2021. FINDINGS: Mediastinal blood pool activity: SUV max 1.5 Liver activity: SUV max 2.8 NECK: No hypermetabolic lymph nodes. Incidental CT findings: None. CHEST: No abnormal hypermetabolism. Incidental CT findings: Atherosclerotic calcification of the aorta and aortic valve. Ascending aorta measures up to 4.4 cm, stable. No pericardial or pleural effusion. Moderate hiatal hernia. ABDOMEN/PELVIS: No abnormal hypermetabolism in the liver, adrenal glands, spleen or pancreas. No hypermetabolic lymph nodes. Incidental CT findings: Liver, gallbladder, adrenal glands, kidneys, spleen and pancreas are unremarkable. Moderate hiatal hernia. Duodenal diverticulum. Small bowel, appendix and colon are otherwise unremarkable. Atherosclerotic calcification of the aorta. SKELETON: An expansile lytic mass in the right sacrum extends into the right sciatic region with an SUV max 14.2 superiorly and 17.1 inferiorly. Overall size appears grossly stable in the very short interval from 02/08/2021. No additional abnormal osseous hypermetabolism. Incidental CT findings: Right hip arthroplasty.  Degenerative changes in the spine. IMPRESSION: 1. Hypermetabolic lytic right sacral mass with extension into the right sciatic region, stable in size from 02/08/2021 and  compatible with the provided history of lymphoma. No evidence of distant disease. 2. Ascending aortic aneurysm as before. Recommend annual imaging followup by CTA or MRA. This recommendation follows 2010 ACCF/AHA/AATS/ACR/ASA/SCA/SCAI/SIR/STS/SVM Guidelines for the Diagnosis and Management of Patients with Thoracic Aortic Disease. Circulation. 2010; 121: G315-V761. Aortic aneurysm NOS (ICD10-I71.9) 3.  Aortic atherosclerosis (ICD10-I70.0). Electronically Signed   By: Lorin Picket M.D.   On: 02/16/2021 15:39   NM PET Image Initial (PI) Skull Base To Thigh  Result Date: 02/09/2021 CLINICAL DATA:  Initial treatment strategy for large B-cell lymphoma. EXAM: NUCLEAR MEDICINE PET SKULL BASE TO THIGH TECHNIQUE: 9.3 mCi  F-18 FDG was injected intravenously. Full-ring PET imaging was performed from the skull base to thigh after the radiotracer. CT data was obtained and used for attenuation correction and anatomic localization. Fasting blood glucose: 94 mg/dl COMPARISON:  Multiple exams, including CT scan 02/03/2020 FINDINGS: Mediastinal blood pool activity: SUV max 2.3 (taken from the abdominal aorta) Liver activity: SUV max 3.4 Unfortunately due to discomfort the patient terminated the PET portion of today's examination with acquisition of only the proximal thighs through the kidneys, but not including vertical levels above the kidneys such as the upper portion of the upper abdomen, chest, and neck. Accordingly we have CT data but no PET data for those regions. The image region does include the only area of known definite tumor based on prior imaging, which is in the right hemipelvis region. NECK: No PET data available Incidental CT findings: Bilateral common carotid atherosclerotic calcifications. CHEST: No PET data available Incidental CT findings: Coronary, aortic arch, and branch vessel atherosclerotic vascular disease. Prior CABG. 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 Moderate-sized hiatal  hernia. Right axillary clips noted. No pathologic adenopathy identified. Mild biapical pleuroparenchymal scarring. Mild scarring in the posterior basal segment left lower lobe. ABDOMEN/PELVIS: There is no PET data about the level of the kidneys, for example involving the spleen, and upper half of the liver. Right pelvic mass eroding the posterior margin of the right sacrum and medial margin of the right iliac bone noted in the right posterior paraspinal musculature, extending along the right S1 and S2 foramina and penetrating specially through the S2 foramen into the right presacral space, with some local mass effect on the adjacent sciatic nerve. The upper posterior paraspinal segment of this continuous mass has a maximum SUV of 20.2 (Deauville 5), and the presacral portion extending through the right sciatic notch along the piriformis muscle has maximum SUV of 22.4 (Deauville 5). Morphologically the mass appears similar to on recent CT examination of 02/02/2021, although the mass does not substantially cross midline and accordingly the measurement for the posterior paraspinal component is a little exaggerated on that exam. Currently the hypermetabolic activity associated with the posterior paraspinal component measures about 4.7 by 4.9 by 8.1 cm, and the oval-shaped component tracking along the piriformis muscle measures about 8.0 by 4.2 by 3.4 cm. No separate hypermetabolic lesion is identified. Incidental CT findings: Atherosclerosis is present, including aortoiliac atherosclerotic disease. Sigmoid colon diverticulosis. Borderline prostatomegaly. SKELETON: Bony erosions of the right posterior sacrum and medial right iliac bone due to the posterior paraspinal component of the pelvic mass identified. No other hypermetabolic activity is identified from the L1-2 level down. Level cephalad to the L1-2 level do not have associated PET data. Incidental CT findings: Degenerative glenohumeral arthropathy bilaterally.  Lumbar spondylosis and degenerative disc disease. IMPRESSION: 1. Unfortunately due to discomfort the patient terminated the PET portion of today's examination with acquisition of only the proximal thighs through the kidneys, but not including vertical levels above the kidneys such as the upper portion of the upper abdomen, the chest, and the neck. There is no known involvement in the region omitted on the PET data based on prior imaging. 2. The known mass of the right posterior paraspinal musculature along the upper sacrum tracking through the S1 and S2 neural foramina into the right piriformis muscle and subsequently out through the sciatic notch is Deauville 5 with measurements as noted above. This erodes the posterior sacrum and part of the adjacent iliac bone. 3. Ascending thoracic aortic aneurysm 4.3  cm in diameter. Recommend annual imaging followup by CTA or MRA. This recommendation follows 2010 ACCF/AHA/AATS/ACR/ASA/SCA/SCAI/SIR/STS/SVM Guidelines for the Diagnosis and Management of Patients with Thoracic Aortic Disease. Circulation. 2010; 121: X381-W299. Aortic aneurysm NOS (ICD10-I71.9) 4. Other imaging findings of potential clinical significance: Aortic Atherosclerosis (ICD10-I70.0). Coronary atherosclerosis with prior CABG. Moderate-sized hiatal hernia. Sigmoid colon diverticulosis. Borderline prostatomegaly. Lumbar spondylosis and degenerative disc disease. Electronically Signed   By: Van Clines M.D.   On: 02/09/2021 12:03   DG Chest Portable 1 View  Result Date: 02/19/2021 CLINICAL DATA:  Evaluate for infiltrate. EXAM: PORTABLE CHEST 1 VIEW COMPARISON:  CT dated 02/02/2021. FINDINGS: Right-sided Port-A-Cath with tip over central SVC. Minimal bibasilar atelectasis. No focal consolidation, pleural effusion, pneumothorax. The cardiac silhouette is within limits. Atherosclerotic calcification of the aorta. Median sternotomy wires and CABG vascular clips. No acute osseous pathology. IMPRESSION: No  active disease. Electronically Signed   By: Anner Crete M.D.   On: 02/19/2021 21:43   CT BONE MARROW BIOPSY  Result Date: 02/09/2021 INDICATION: B-cell lymphoma EXAM: CT BIOPSY BONE MARROW MEDICATIONS: None. ANESTHESIA/SEDATION: Moderate (conscious) sedation was employed during this procedure. A total of Versed 1 mg and Fentanyl 25 mcg was administered intravenously. Moderate Sedation Time: 15 minutes. The patient's level of consciousness and vital signs were monitored continuously by radiology nursing throughout the procedure under my direct supervision. FLUOROSCOPY TIME:  N/a COMPLICATIONS: None immediate. PROCEDURE: Informed written consent was obtained from the patient after a thorough discussion of the procedural risks, benefits and alternatives. All questions were addressed. Maximal Sterile Barrier Technique was utilized including caps, mask, sterile gowns, sterile gloves, sterile drape, hand hygiene and skin antiseptic. A timeout was performed prior to the initiation of the procedure. The patient was placed prone on the CT exam table. Limited CT of the pelvis was performed for planning purposes. Skin entry site was marked, and the overlying skin was prepped and draped in the standard sterile fashion. Local analgesia was obtained with 1% lidocaine. Using CT guidance, an 11 gauge needle was advanced just deep to the cortex of the right posterior ilium. Subsequently, bone marrow aspiration and core biopsy were performed. Specimens were submitted to lab/pathology for handling. Hemostasis was achieved with manual pressure, and a clean dressing was placed. The patient tolerated the procedure well without immediate complication. IMPRESSION: Successful CT-guided bone marrow aspiration and core biopsy of the right posterior ilium. Electronically Signed   By: Albin Felling M.D.   On: 02/09/2021 10:33   ECHOCARDIOGRAM COMPLETE  Result Date: 02/15/2021    ECHOCARDIOGRAM REPORT   Patient Name:   DEMANI MCBRIEN Date of Exam: 02/15/2021 Medical Rec #:  [id number]      Height:       69.0 in Accession #:    [id number]     Weight:       163.0 lb Date of Birth:  1946/01/04       BSA:          1.894 m Patient Age:    28 years       BP:           163/84 mmHg Patient Gender: M              HR:           82 bpm. Exam Location:  [facility] Procedure: 2D Echo, Cardiac Doppler and Color Doppler Indications:     Chemo Z09  History:         Patient has no prior  history of Echocardiogram examinations.                  Previous Myocardial Infarction and CAD, Prior CABG; Risk                  Factors:Hypertension and Dyslipidemia.  Sonographer:     Sherrie Sport Referring Phys:  [id number] Lloyd Huger Diagnosing Phys: Nelva Bush MD  Sonographer Comments: Suboptimal apical window and suboptimal subcostal window. IMPRESSIONS  1. Left ventricular ejection fraction, by estimation, is 60 to 65%. The left ventricle has normal function. The left ventricle has no regional wall motion abnormalities. The left ventricular internal cavity size was mildly dilated. Left ventricular diastolic parameters are consistent with Grade I diastolic dysfunction (impaired relaxation).  2. Right ventricular systolic function is mildly reduced. The right ventricular size is normal. Mildly increased right ventricular wall thickness.  3. The mitral valve is grossly normal. No evidence of mitral valve regurgitation. No evidence of mitral stenosis.  4. The aortic valve is tricuspid. There is mild calcification of the aortic valve. There is mild thickening of the aortic valve. Aortic valve regurgitation is mild.  5. Aortic dilatation noted. 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 FINDINGS  Left Ventricle: Left ventricular ejection fraction, by estimation, is 60 to 65%. The left ventricle has normal function. The left ventricle has no regional wall motion abnormalities. The left ventricular internal cavity size was mildly dilated. There is  no left  ventricular hypertrophy. Abnormal (paradoxical) septal motion consistent with post-operative status. Left ventricular diastolic parameters are consistent with Grade I diastolic dysfunction (impaired relaxation). Right Ventricle: The right ventricular size is normal. Mildly increased right ventricular wall thickness. Right ventricular systolic function is mildly reduced. Left Atrium: Left atrial size was normal in size. Right Atrium: Right atrial size was normal in size. Pericardium: There is no evidence of pericardial effusion. Mitral Valve: The mitral valve is grossly normal. No evidence of mitral valve regurgitation. No evidence of mitral valve stenosis. MV peak gradient, 2.4 mmHg. The mean mitral valve gradient is 1.0 mmHg. Tricuspid Valve: The tricuspid valve is not well visualized. Tricuspid valve regurgitation is mild. Aortic Valve: The aortic valve is tricuspid. There is mild calcification of the aortic valve. There is mild thickening of the aortic valve. Aortic valve regurgitation is mild. Aortic valve mean gradient measures 2.0 mmHg. Aortic valve peak gradient measures 3.6 mmHg. Aortic valve area, by VTI measures 4.86 cm. Pulmonic Valve: The pulmonic valve was not well visualized. Pulmonic valve regurgitation is not visualized. No evidence of pulmonic stenosis. Aorta: Aortic dilatation noted. 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 Pulmonary Artery: The pulmonary artery is of normal size. IAS/Shunts: The interatrial septum was not well visualized.  LEFT VENTRICLE PLAX 2D LVIDd:         3.70 cm   Diastology LVIDs:         2.20 cm   LV e' medial:    5.77 cm/s LV PW:         1.30 cm   LV E/e' medial:  8.7 LV IVS:        1.00 cm   LV e' lateral:   14.30 cm/s LVOT diam:     2.20 cm   LV E/e' lateral: 3.5 LV SV:         69 LV SV Index:   36 LVOT Area:     3.80 cm  RIGHT VENTRICLE RV Basal diam:  4.00 cm RV S prime:     9.79 cm/s TAPSE (M-mode): 1.2 cm LEFT ATRIUM             Index         RIGHT ATRIUM           Index LA diam:        1.90 cm 1.00 cm/m   RA Area:     12.90 cm LA Vol (A2C):   29.4 ml 15.52 ml/m  RA Volume:   30.70 ml  16.21 ml/m LA Vol (A4C):   15.3 ml 8.08 ml/m LA Biplane Vol: 22.2 ml 11.72 ml/m  AORTIC VALVE                    PULMONIC VALVE AV Area (Vmax):    3.75 cm     PV Vmax:        0.67 m/s AV Area (Vmean):   4.06 cm     PV Vmean:       45.400 cm/s AV Area (VTI):     4.86 cm     PV VTI:         0.097 m AV Vmax:           95.40 cm/s   PV Peak grad:   1.8 mmHg AV Vmean:          65.700 cm/s  PV Mean grad:   1.0 mmHg AV VTI:            0.142 m      RVOT Peak grad: 2 mmHg AV Peak Grad:      3.6 mmHg AV Mean Grad:      2.0 mmHg LVOT Vmax:         94.00 cm/s LVOT Vmean:        70.200 cm/s LVOT VTI:          0.181 m LVOT/AV VTI ratio: 1.28  AORTA Ao Root diam: 4.10 cm MITRAL VALVE               TRICUSPID VALVE MV Area (PHT): 2.43 cm    TR Peak grad:   14.6 mmHg MV Area VTI:   3.98 cm    TR Vmax:        191.00 cm/s MV Peak grad:  2.4 mmHg MV Mean grad:  1.0 mmHg    SHUNTS MV Vmax:       0.78 m/s    Systemic VTI:  0.18 m MV Vmean:      40.9 cm/s   Systemic Diam: 2.20 cm MV Decel Time: 312 msec    Pulmonic VTI:  0.081 m MV E velocity: 50.10 cm/s MV A velocity: 56.10 cm/s MV E/A ratio:  0.89 Christopher End MD Electronically signed by Nelva Bush MD Signature Date/Time: 02/15/2021/9:47:17 AM    Final    IR IMAGING GUIDED PORT INSERTION  Result Date: 02/11/2021 INDICATION: b cell lymphoma EXAM: 1. Ultrasound-guided puncture of the right internal jugular vein 2. Placement of a right-sided chest port using fluoroscopic guidance MEDICATIONS: None ANESTHESIA/SEDATION: Moderate (conscious) sedation was employed during this procedure. A total of Versed 2 mg and Fentanyl 100 mcg was administered intravenously. Moderate Sedation Time: 26 minutes. The patient's level of consciousness and vital signs were monitored continuously by radiology nursing throughout the procedure under my  direct supervision. FLUOROSCOPY TIME:  Fluoroscopy Time: 0.7 minutes with 3 exposures COMPLICATIONS: None immediate. PROCEDURE: Informed written consent was obtained from the patient after a thorough discussion of  the procedural risks, benefits and alternatives. All questions were addressed. Maximal Sterile Barrier Technique was utilized including caps, mask, sterile gowns, sterile gloves, sterile drape, hand hygiene and skin antiseptic. A timeout was performed prior to the initiation of the procedure. The patient was placed supine on the exam table. The right neck and chest was prepped and draped in the standard sterile fashion. A preliminary ultrasound of the right neck was performed and demonstrates a patent right internal jugular vein. A permanent ultrasound image was stored in the electronic medical record. The overlying skin was anesthetized with 1% Lidocaine. Using ultrasound guidance, access was obtained into the right internal jugular vein using a 21 gauge micropuncture set. A wire was advanced into the SVC, a short incision was made at the puncture site, and serial dilatation performed. Next, in an ipsilateral infraclavicular location, an incision was made at the site of the subcutaneous reservoir. Blunt dissection was used to open a pocket to contain the reservoir. A subcutaneous tunnel was then created from the port site to the puncture site. A(n) 8 Fr single lumen catheter was advanced through the tunnel. The catheter was attached to the port and this was placed in the subcutaneous pocket. Under fluoroscopic guidance, a peel away sheath was placed, and the catheter was trimmed to the appropriate length and was advanced into the central veins. The catheter length is 26 cm. The tip of the catheter lies near the superior cavoatrial junction. The port flushes and aspirates appropriately. The port was flushed and locked with heparinized saline. The port pocket was closed in 2 layers using 3-0 and 4-0  Vicryl/absorbable suture. Dermabond was also applied to both incisions. The patient tolerated the procedure well and was transferred to recovery in stable condition. IMPRESSION: Successful placement of a right chest port via the right internal jugular vein. The port is ready for immediate use. Electronically Signed   By: Albin Felling M.D.   On: 02/11/2021 12:02   DG FLUORO GUIDED LOC OF NEEDLE/CATH TIP FOR SPINAL INJECT LT  Result Date: 02/04/2021 CLINICAL DATA:  Patient with B-cell lymphoma request received for diagnostic lumbar puncture. EXAM: DIAGNOSTIC LUMBAR PUNCTURE UNDER FLUOROSCOPIC GUIDANCE COMPARISON:  MR lumbar spine performed 01/12/2021 reviewed prior to the procedure. FLUOROSCOPY TIME:  Fluoroscopy Time: 0.8 Minute Radiation Exposure Index (if provided by the fluoroscopic device): 17.80 mGy Number of Acquired Spot Images: 3 PROCEDURE: Informed consent was obtained from the patient/patient's legal guardian prior to the procedure, including potential complications of bleeding, infection, paresthesias, nerve damage, CSF leak requiring additional procedures, post procedure requirement to lay flat for several hours after the procedure, headache, allergy, and pain. With the patient prone, the lower back was prepped with Betadine. 1% Lidocaine was used for local anesthesia. Attempted lumbar puncture was performed at the L2-L3 however unable to get fluid back, inner stylet of the needle was replaced and the needle was removed in it's entirety. Lumbar puncture was then performed at the L1-L2 level using a 20 gauge needle with return of slight blood-tinged at first which turned to colorless CSF. 15 ml of CSF were obtained for laboratory studies. The inner stylet was placed back into the needle and the needle was removed in its entirety. The patient tolerated the procedure well and there were no apparent complications. A sterile bandage was applied. IMPRESSION: Technically successful fluoroscopic guided  lumbar puncture. This exam was performed by Tsosie Billing PA-C, and was supervised and interpreted by Dr. Jearld Lesch. Electronically Signed   By: Elbert Ewings  Patel M.D.   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  There is also some enhancing tumor in the right S1 neuroforamen displacing the nerve root anteriorly.  Biopsy confirmed diagnosis.  PET scan and bone marrow biopsy did not reveal any distant disease.  Spinal fluid was negative for malignancy.  Patient has had port placement.  Echo revealed EF of 65% and adequate to proceed with treatment.  Patient will likely receive 4 cycles of R-CHOP chemotherapy with Udenyca support every 3 weeks and then referral to radiation oncology for adjuvant XRT given the bulky nature of patient's disease.  Proceed with cycle 1 of R-CHOP today.  Patient confirmed he has prescription for prednisone 100 mg x 5 days.  Return to clinic tomorrow for [facility].  Patient will then return to clinic in 1 week for laboratory work and further evaluation and then in 3 weeks for consideration of cycle 2. 2.  Back pain: Better controlled.  Appreciate palliative care input.  Patient declined long-acting narcotics and states he will continue with 5 mg oxycodone as needed. 3.  Leukocytosis: Likely reactive, monitor. 4.  Thrombocytosis: Reactive, monitor.    Patient expressed understanding and was in agreement with this plan. He also understands that He can call clinic at any time with any questions, concerns, or complaints.    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

## 2021-03-01 NOTE — Progress Notes (Signed)
Pt presents to [facility] with uncontrolled right hip pain. Currently rates pain 8/10. States that the Norco nor the lidocaine cream are helping much.

## 2021-03-03 ENCOUNTER — Inpatient Hospital Stay: Payer: Medicare Other

## 2021-03-03 ENCOUNTER — Telehealth: Payer: Self-pay | Admitting: Emergency Medicine

## 2021-03-03 ENCOUNTER — Inpatient Hospital Stay (HOSPITAL_BASED_OUTPATIENT_CLINIC_OR_DEPARTMENT_OTHER): Payer: Medicare Other | Admitting: Oncology

## 2021-03-03 ENCOUNTER — Other Ambulatory Visit: Payer: Self-pay

## 2021-03-03 VITALS — BP 128/68 | HR 75 | Temp 97.9°F | Resp 16 | Wt 157.1 lb

## 2021-03-03 VITALS — BP 135/74 | HR 83 | Temp 98.7°F | Resp 18

## 2021-03-03 DIAGNOSIS — Z5112 Encounter for antineoplastic immunotherapy: Secondary | ICD-10-CM | POA: Diagnosis not present

## 2021-03-03 DIAGNOSIS — C8510 Unspecified B-cell lymphoma, unspecified site: Secondary | ICD-10-CM

## 2021-03-03 LAB — CBC WITH DIFFERENTIAL/PLATELET
Abs Immature Granulocytes: 0.35 10*3/uL — ABNORMAL HIGH (ref 0.00–0.07)
Basophils Absolute: 0.1 10*3/uL (ref 0.0–0.1)
Basophils Relative: 0 %
Eosinophils Absolute: 0 10*3/uL (ref 0.0–0.5)
Eosinophils Relative: 0 %
HCT: 43.5 % (ref 39.0–52.0)
Hemoglobin: 14.8 g/dL (ref 13.0–17.0)
Immature Granulocytes: 2 %
Lymphocytes Relative: 13 %
Lymphs Abs: 2.9 10*3/uL (ref 0.7–4.0)
MCH: 31 pg (ref 26.0–34.0)
MCHC: 34 g/dL (ref 30.0–36.0)
MCV: 91 fL (ref 80.0–100.0)
Monocytes Absolute: 0.9 10*3/uL (ref 0.1–1.0)
Monocytes Relative: 4 %
Neutro Abs: 17.6 10*3/uL — ABNORMAL HIGH (ref 1.7–7.7)
Neutrophils Relative %: 81 %
Platelets: 445 10*3/uL — ABNORMAL HIGH (ref 150–400)
RBC: 4.78 MIL/uL (ref 4.22–5.81)
RDW: 14 % (ref 11.5–15.5)
WBC: 21.8 10*3/uL — ABNORMAL HIGH (ref 4.0–10.5)
nRBC: 0 % (ref 0.0–0.2)

## 2021-03-03 LAB — COMPREHENSIVE METABOLIC PANEL
ALT: 19 U/L (ref 0–44)
AST: 28 U/L (ref 15–41)
Albumin: 3.4 g/dL — ABNORMAL LOW (ref 3.5–5.0)
Alkaline Phosphatase: 56 U/L (ref 38–126)
Anion gap: 11 (ref 5–15)
BUN: 33 mg/dL — ABNORMAL HIGH (ref 8–23)
CO2: 21 mmol/L — ABNORMAL LOW (ref 22–32)
Calcium: 9 mg/dL (ref 8.9–10.3)
Chloride: 100 mmol/L (ref 98–111)
Creatinine, Ser: 1.07 mg/dL (ref 0.61–1.24)
GFR, Estimated: >60 mL/min (ref >60)
Glucose, Bld: 152 mg/dL — ABNORMAL HIGH (ref 70–99)
Potassium: 4.2 mmol/L (ref 3.5–5.1)
Sodium: 132 mmol/L — ABNORMAL LOW (ref 135–145)
Total Bilirubin: 0.3 mg/dL (ref 0.3–1.2)
Total Protein: 6.3 g/dL — ABNORMAL LOW (ref 6.5–8.1)

## 2021-03-03 LAB — HEPATITIS B SURFACE ANTIGEN: Hepatitis B Surface Ag: NONREACTIVE

## 2021-03-03 LAB — HEPATITIS B CORE ANTIBODY, TOTAL: Hep B Core Total Ab: NONREACTIVE

## 2021-03-03 MED ORDER — SODIUM CHLORIDE 0.9 % IV SOLN
10.0000 mg | Freq: Once | INTRAVENOUS | Status: AC
  Administered 2021-03-03: 10 mg via INTRAVENOUS
  Filled 2021-03-03: qty 10

## 2021-03-03 MED ORDER — SODIUM CHLORIDE 0.9 % IV SOLN
750.0000 mg/m2 | Freq: Once | INTRAVENOUS | Status: AC
  Administered 2021-03-03: 1400 mg via INTRAVENOUS
  Filled 2021-03-03: qty 50

## 2021-03-03 MED ORDER — SODIUM CHLORIDE 0.9 % IV SOLN
150.0000 mg | Freq: Once | INTRAVENOUS | Status: AC
  Administered 2021-03-03: 150 mg via INTRAVENOUS
  Filled 2021-03-03: qty 150

## 2021-03-03 MED ORDER — DIPHENHYDRAMINE HCL 25 MG PO CAPS
25.0000 mg | ORAL_CAPSULE | Freq: Once | ORAL | Status: AC
  Administered 2021-03-03: 25 mg via ORAL
  Filled 2021-03-03: qty 1

## 2021-03-03 MED ORDER — DOXORUBICIN HCL CHEMO IV INJECTION 2 MG/ML
50.0000 mg/m2 | Freq: Once | INTRAVENOUS | Status: AC
  Administered 2021-03-03: 94 mg via INTRAVENOUS
  Filled 2021-03-03: qty 47

## 2021-03-03 MED ORDER — VINCRISTINE SULFATE CHEMO INJECTION 1 MG/ML
2.0000 mg | Freq: Once | INTRAVENOUS | Status: AC
  Administered 2021-03-03: 2 mg via INTRAVENOUS
  Filled 2021-03-03: qty 2

## 2021-03-03 MED ORDER — PALONOSETRON HCL INJECTION 0.25 MG/5ML
0.2500 mg | Freq: Once | INTRAVENOUS | Status: AC
  Administered 2021-03-03: 0.25 mg via INTRAVENOUS
  Filled 2021-03-03: qty 5

## 2021-03-03 MED ORDER — SODIUM CHLORIDE 0.9 % IV SOLN
375.0000 mg/m2 | Freq: Once | INTRAVENOUS | Status: AC
  Administered 2021-03-03: 700 mg via INTRAVENOUS
  Filled 2021-03-03: qty 50

## 2021-03-03 MED ORDER — HEPARIN SOD (PORK) LOCK FLUSH 100 UNIT/ML IV SOLN
500.0000 [IU] | Freq: Once | INTRAVENOUS | Status: AC | PRN
  Administered 2021-03-03: 500 [IU]
  Filled 2021-03-03: qty 5

## 2021-03-03 MED ORDER — SODIUM CHLORIDE 0.9 % IV SOLN
Freq: Once | INTRAVENOUS | Status: AC
  Filled 2021-03-03: qty 250

## 2021-03-03 MED ORDER — ACETAMINOPHEN 325 MG PO TABS
650.0000 mg | ORAL_TABLET | Freq: Once | ORAL | Status: AC
  Administered 2021-03-03: 650 mg via ORAL
  Filled 2021-03-03: qty 2

## 2021-03-03 NOTE — Patient Instructions (Signed)
[facility] AT [place]  Discharge Instructions: Thank you for choosing [facility] to provide your oncology and hematology care.  If you have a lab appointment with the [facility], please go directly to the [facility] and check in at the registration area.  Wear comfortable clothing and clothing appropriate for easy access to any Portacath or PICC line.   We strive to give you quality time with your provider. You may need to reschedule your appointment if you arrive late (15 or more minutes).  Arriving late affects you and other patients whose appointments are after yours.  Also, if you miss three or more appointments without notifying the office, you may be dismissed from the clinic at the providers discretion.      For prescription refill requests, have your pharmacy contact our office and allow 72 hours for refills to be completed.    Today you received the following chemotherapy and/or immunotherapy agents: RCHOP      To help prevent nausea and vomiting after your treatment, we encourage you to take your nausea medication as directed.  BELOW ARE SYMPTOMS THAT SHOULD BE REPORTED IMMEDIATELY: *FEVER GREATER THAN 100.4 F (38 C) OR HIGHER *CHILLS OR SWEATING *NAUSEA AND VOMITING THAT IS NOT CONTROLLED WITH YOUR NAUSEA MEDICATION *UNUSUAL SHORTNESS OF BREATH *UNUSUAL BRUISING OR BLEEDING *URINARY PROBLEMS (pain or burning when urinating, or frequent urination) *BOWEL PROBLEMS (unusual diarrhea, constipation, pain near the anus) TENDERNESS IN MOUTH AND THROAT WITH OR WITHOUT PRESENCE OF ULCERS (sore throat, sores in mouth, or a toothache) UNUSUAL RASH, SWELLING OR PAIN  UNUSUAL VAGINAL DISCHARGE OR ITCHING   Items with * indicate a potential emergency and should be followed up as soon as possible or go to the Emergency Department if any problems should occur.  Please show the CHEMOTHERAPY ALERT CARD or IMMUNOTHERAPY ALERT CARD at check-in to the  Emergency Department and triage nurse.  Should you have questions after your visit or need to cancel or reschedule your appointment, please contact [facility] [place] AT [place]  [phone] and follow the prompts.  Office hours are 8:00 a.m. to 4:30 p.m. Monday - Friday. Please note that voicemails left after 4:00 p.m. may not be returned until the following business day.  We are closed weekends and major holidays. You have access to a nurse at all times for urgent questions. Please call the main number to the clinic [phone] and follow the prompts.  For any non-urgent questions, you may also contact your provider using MyChart. We now offer e-Visits for anyone 63 and older to request care online for non-urgent symptoms. For details visit [email or web address].   Also download the MyChart app! Go to the app store, search "MyChart", open the app, select [place], and log in with your MyChart username and password.  Due to Covid, a mask is required upon entering the hospital/clinic. If you do not have a mask, one will be given to you upon arrival. For doctor visits, patients may have 1 support person aged 13 or older with them. For treatment visits, patients cannot have anyone with them due to current Covid guidelines and our immunocompromised population. Doxorubicin injection What is this medication? DOXORUBICIN (dox oh ROO bi sin) is a chemotherapy drug. It is used to treat many kinds of cancer like leukemia, lymphoma, neuroblastoma, sarcoma, and Wilms' tumor. It is also used to treat bladder cancer, breast cancer, lung cancer, ovarian cancer, stomach cancer, and thyroid cancer. This medicine may be used  for other purposes; ask your health care provider or pharmacist if you have questions. COMMON BRAND NAME(S): Adriamycin, Adriamycin PFS, Adriamycin RDF, Rubex What should I tell my care team before I take this medication? They need to know if you have any of these  conditions: heart disease history of low blood counts caused by a medicine liver disease recent or ongoing radiation therapy an unusual or allergic reaction to doxorubicin, other chemotherapy agents, other medicines, foods, dyes, or preservatives pregnant or trying to get pregnant breast-feeding How should I use this medication? This drug is given as an infusion into a vein. It is administered in a hospital or clinic by a specially trained health care professional. If you have pain, swelling, burning or any unusual feeling around the site of your injection, tell your health care professional right away. Talk to your pediatrician regarding the use of this medicine in children. Special care may be needed. Overdosage: If you think you have taken too much of this medicine contact a poison control center or emergency room at once. NOTE: This medicine is only for you. Do not share this medicine with others. What if I miss a dose? It is important not to miss your dose. Call your doctor or health care professional if you are unable to keep an appointment. What may interact with this medication? This medicine may interact with the following medications: 6-mercaptopurine paclitaxel phenytoin St. John's Wort trastuzumab verapamil This list may not describe all possible interactions. Give your health care provider a list of all the medicines, herbs, non-prescription drugs, or dietary supplements you use. Also tell them if you smoke, drink alcohol, or use illegal drugs. Some items may interact with your medicine. What should I watch for while using this medication? This drug may make you feel generally unwell. This is not uncommon, as chemotherapy can affect healthy cells as well as cancer cells. Report any side effects. Continue your course of treatment even though you feel ill unless your doctor tells you to stop. There is a maximum amount of this medicine you should receive throughout your life. The  amount depends on the medical condition being treated and your overall health. Your doctor will watch how much of this medicine you receive in your lifetime. Tell your doctor if you have taken this medicine before. You may need blood work done while you are taking this medicine. Your urine may turn red for a few days after your dose. This is not blood. If your urine is dark or brown, call your doctor. In some cases, you may be given additional medicines to help with side effects. Follow all directions for their use. Call your doctor or health care professional for advice if you get a fever, chills or sore throat, or other symptoms of a cold or flu. Do not treat yourself. This drug decreases your body's ability to fight infections. Try to avoid being around people who are sick. This medicine may increase your risk to bruise or bleed. Call your doctor or health care professional if you notice any unusual bleeding. Talk to your doctor about your risk of cancer. You may be more at risk for certain types of cancers if you take this medicine. Do not become pregnant while taking this medicine or for 6 months after stopping it. Women should inform their doctor if they wish to become pregnant or think they might be pregnant. Men should not father a child while taking this medicine and for 6 months after stopping it.  There is a potential for serious side effects to an unborn child. Talk to your health care professional or pharmacist for more information. Do not breast-feed an infant while taking this medicine. This medicine has caused ovarian failure in some women and reduced sperm counts in some men This medicine may interfere with the ability to have a child. Talk with your doctor or health care professional if you are concerned about your fertility. This medicine may cause a decrease in Co-Enzyme Q-10. You should make sure that you get enough Co-Enzyme Q-10 while you are taking this medicine. Discuss the foods you  eat and the vitamins you take with your health care professional. What side effects may I notice from receiving this medication? Side effects that you should report to your doctor or health care professional as soon as possible: allergic reactions like skin rash, itching or hives, swelling of the face, lips, or tongue breathing problems chest pain fast or irregular heartbeat low blood counts - this medicine may decrease the number of white blood cells, red blood cells and platelets. You may be at increased risk for infections and bleeding. pain, redness, or irritation at site where injected signs of infection - fever or chills, cough, sore throat, pain or difficulty passing urine signs of decreased platelets or bleeding - bruising, pinpoint red spots on the skin, black, tarry stools, blood in the urine swelling of the ankles, feet, hands tiredness weakness Side effects that usually do not require medical attention (report to your doctor or health care professional if they continue or are bothersome): diarrhea hair loss mouth sores nail discoloration or damage nausea red colored urine vomiting This list may not describe all possible side effects. Call your doctor for medical advice about side effects. You may report side effects to FDA at 1-800-FDA-1088. Where should I keep my medication? This drug is given in a hospital or clinic and will not be stored at home. NOTE: This sheet is a summary. It may not cover all possible information. If you have questions about this medicine, talk to your doctor, pharmacist, or health care provider.  2022 Elsevier/Gold Standard (2016-10-05 00:00:00) Cyclophosphamide Injection What is this medication? CYCLOPHOSPHAMIDE (sye kloe FOSS fa mide) is a chemotherapy drug. It slows the growth of cancer cells. This medicine is used to treat many types of cancer like lymphoma, myeloma, leukemia, breast cancer, and ovarian cancer, to name a few. This medicine may  be used for other purposes; ask your health care provider or pharmacist if you have questions. COMMON BRAND NAME(S): Cytoxan, Neosar What should I tell my care team before I take this medication? They need to know if you have any of these conditions: heart disease history of irregular heartbeat infection kidney disease liver disease low blood counts, like white cells, platelets, or red blood cells on hemodialysis recent or ongoing radiation therapy scarring or thickening of the lungs trouble passing urine an unusual or allergic reaction to cyclophosphamide, other medicines, foods, dyes, or preservatives pregnant or trying to get pregnant breast-feeding How should I use this medication? This drug is usually given as an injection into a vein or muscle or by infusion into a vein. It is administered in a hospital or clinic by a specially trained health care professional. Talk to your pediatrician regarding the use of this medicine in children. Special care may be needed. Overdosage: If you think you have taken too much of this medicine contact a poison control center or emergency room at once. NOTE: This  medicine is only for you. Do not share this medicine with others. What if I miss a dose? It is important not to miss your dose. Call your doctor or health care professional if you are unable to keep an appointment. What may interact with this medication? amphotericin B azathioprine certain antivirals for HIV or hepatitis certain medicines for blood pressure, heart disease, irregular heart beat certain medicines that treat or prevent blood clots like warfarin certain other medicines for cancer cyclosporine etanercept indomethacin medicines that relax muscles for surgery medicines to increase blood counts metronidazole This list may not describe all possible interactions. Give your health care provider a list of all the medicines, herbs, non-prescription drugs, or dietary supplements  you use. Also tell them if you smoke, drink alcohol, or use illegal drugs. Some items may interact with your medicine. What should I watch for while using this medication? Your condition will be monitored carefully while you are receiving this medicine. You may need blood work done while you are taking this medicine. Drink water or other fluids as directed. Urinate often, even at night. Some products may contain alcohol. Ask your health care professional if this medicine contains alcohol. Be sure to tell all health care professionals you are taking this medicine. Certain medicines, like metronidazole and disulfiram, can cause an unpleasant reaction when taken with alcohol. The reaction includes flushing, headache, nausea, vomiting, sweating, and increased thirst. The reaction can last from 30 minutes to several hours. Do not become pregnant while taking this medicine or for 1 year after stopping it. Women should inform their health care professional if they wish to become pregnant or think they might be pregnant. Men should not father a child while taking this medicine and for 4 months after stopping it. There is potential for serious side effects to an unborn child. Talk to your health care professional for more information. Do not breast-feed an infant while taking this medicine or for 1 week after stopping it. This medicine has caused ovarian failure in some women. This medicine may make it more difficult to get pregnant. Talk to your health care professional if you are concerned about your fertility. This medicine has caused decreased sperm counts in some men. This may make it more difficult to father a child. Talk to your health care professional if you are concerned about your fertility. Call your health care professional for advice if you get a fever, chills, or sore throat, or other symptoms of a cold or flu. Do not treat yourself. This medicine decreases your body's ability to fight infections.  Try to avoid being around people who are sick. Avoid taking medicines that contain aspirin, acetaminophen, ibuprofen, naproxen, or ketoprofen unless instructed by your health care professional. These medicines may hide a fever. Talk to your health care professional about your risk of cancer. You may be more at risk for certain types of cancer if you take this medicine. If you are going to need surgery or other procedure, tell your health care professional that you are using this medicine. Be careful brushing or flossing your teeth or using a toothpick because you may get an infection or bleed more easily. If you have any dental work done, tell your dentist you are receiving this medicine. What side effects may I notice from receiving this medication? Side effects that you should report to your doctor or health care professional as soon as possible: allergic reactions like skin rash, itching or hives, swelling of the face, lips, or  tongue breathing problems nausea, vomiting signs and symptoms of bleeding such as bloody or black, tarry stools; red or dark brown urine; spitting up blood or brown material that looks like coffee grounds; red spots on the skin; unusual bruising or bleeding from the eyes, gums, or nose signs and symptoms of heart failure like fast, irregular heartbeat, sudden weight gain; swelling of the ankles, feet, hands signs and symptoms of infection like fever; chills; cough; sore throat; pain or trouble passing urine signs and symptoms of kidney injury like trouble passing urine or change in the amount of urine signs and symptoms of liver injury like dark yellow or brown urine; general ill feeling or flu-like symptoms; light-colored stools; loss of appetite; nausea; right upper belly pain; unusually weak or tired; yellowing of the eyes or skin Side effects that usually do not require medical attention (report to your doctor or health care professional if they continue or are  bothersome): confusion decreased hearing diarrhea facial flushing hair loss headache loss of appetite missed menstrual periods signs and symptoms of low red blood cells or anemia such as unusually weak or tired; feeling faint or lightheaded; falls skin discoloration This list may not describe all possible side effects. Call your doctor for medical advice about side effects. You may report side effects to FDA at 1-800-FDA-1088. Where should I keep my medication? This drug is given in a hospital or clinic and will not be stored at home. NOTE: This sheet is a summary. It may not cover all possible information. If you have questions about this medicine, talk to your doctor, pharmacist, or health care provider.  2022 Elsevier/Gold Standard (2020-10-19 00:00:00) Vincristine injection What is this medication? VINCRISTINE (vin KRIS teen) is a chemotherapy drug. It slows the growth of cancer cells. This medicine is used to treat many types of cancer like Hodgkin's disease, leukemia, non-Hodgkin's lymphoma, neuroblastoma (brain cancer), rhabdomyosarcoma, and Wilms' tumor. This medicine may be used for other purposes; ask your health care provider or pharmacist if you have questions. COMMON BRAND NAME(S): Oncovin, Vincasar PFS What should I tell my care team before I take this medication? They need to know if you have any of these conditions: blood disorders gout infection (especially chickenpox, cold sores, or herpes) kidney disease liver disease lung disease nervous system disease like Charcot-Marie-Tooth (CMT) recent or ongoing radiation therapy an unusual or allergic reaction to vincristine, other chemotherapy agents, other medicines, foods, dyes, or preservatives pregnant or trying to get pregnant breast-feeding How should I use this medication? This drug is given as an infusion into a vein. It is administered in a hospital or clinic by a specially trained health care professional. If  you have pain, swelling, burning, or any unusual feeling around the site of your injection, tell your health care professional right away. Talk to your pediatrician regarding the use of this medicine in children. While this drug may be prescribed for selected conditions, precautions do apply. Overdosage: If you think you have taken too much of this medicine contact a poison control center or emergency room at once. NOTE: This medicine is only for you. Do not share this medicine with others. What if I miss a dose? It is important not to miss your dose. Call your doctor or health care professional if you are unable to keep an appointment. What may interact with this medication? certain medicines for fungal infections like itraconazole, ketoconazole, posaconazole, voriconazole certain medicines for seizures like phenytoin This list may not describe all possible interactions.  Give your health care provider a list of all the medicines, herbs, non-prescription drugs, or dietary supplements you use. Also tell them if you smoke, drink alcohol, or use illegal drugs. Some items may interact with your medicine. What should I watch for while using this medication? This drug may make you feel generally unwell. This is not uncommon, as chemotherapy can affect healthy cells as well as cancer cells. Report any side effects. Continue your course of treatment even though you feel ill unless your doctor tells you to stop. You may need blood work done while you are taking this medicine. This medicine will cause constipation. Try to have a bowel movement at least every 2 to 3 days. If you do not have a bowel movement for 3 days, call your doctor or health care professional. In some cases, you may be given additional medicines to help with side effects. Follow all directions for their use. Do not become pregnant while taking this medicine. Women should inform their doctor if they wish to become pregnant or think they might  be pregnant. There is a potential for serious side effects to an unborn child. Talk to your health care professional or pharmacist for more information. Do not breast-feed an infant while taking this medicine. This medicine may make it more difficult to get pregnant or to father a child. Talk to your healthcare professional if you are concerned about your fertility. What side effects may I notice from receiving this medication? Side effects that you should report to your doctor or health care professional as soon as possible: allergic reactions like skin rash, itching or hives, swelling of the face, lips, or tongue breathing problems confusion or changes in emotions or moods constipation cough mouth sores muscle weakness nausea and vomiting pain, swelling, redness or irritation at the injection site pain, tingling, numbness in the hands or feet problems with balance, talking, walking seizures stomach pain trouble passing urine or change in the amount of urine Side effects that usually do not require medical attention (report to your doctor or health care professional if they continue or are bothersome): diarrhea hair loss jaw pain loss of appetite This list may not describe all possible side effects. Call your doctor for medical advice about side effects. You may report side effects to FDA at 1-800-FDA-1088. Where should I keep my medication? This drug is given in a hospital or clinic and will not be stored at home. NOTE: This sheet is a summary. It may not cover all possible information. If you have questions about this medicine, talk to your doctor, pharmacist, or health care provider.  2022 Elsevier/Gold Standard (2020-10-19 00:00:00) Rituximab Injection What is this medication? RITUXIMAB (ri TUX i mab) is a monoclonal antibody. It is used to treat certain types of cancer like non-Hodgkin lymphoma and chronic lymphocytic leukemia. It is also used to treat rheumatoid arthritis,  granulomatosis with polyangiitis, microscopic polyangiitis, and pemphigus vulgaris. This medicine may be used for other purposes; ask your health care provider or pharmacist if you have questions. COMMON BRAND NAME(S): RIABNI, Rituxan, RUXIENCE What should I tell my care team before I take this medication? They need to know if you have any of these conditions: chest pain heart disease infection especially a viral infection such as chickenpox, cold sores, hepatitis B, or herpes immune system problems irregular heartbeat or rhythm kidney disease low blood counts (white cells, platelets, or red cells) lung disease recent or upcoming vaccine an unusual or allergic reaction to rituximab, other  medicines, foods, dyes, or preservatives pregnant or trying to get pregnant breast-feeding How should I use this medication? This medicine is injected into a vein. It is given by a health care provider in a hospital or clinic setting. A special MedGuide will be given to you before each treatment. Be sure to read this information carefully each time. Talk to your health care provider about the use of this medicine in children. While this drug may be prescribed for children as young as 6 months for selected conditions, precautions do apply. Overdosage: If you think you have taken too much of this medicine contact a poison control center or emergency room at once. NOTE: This medicine is only for you. Do not share this medicine with others. What if I miss a dose? Keep appointments for follow-up doses. It is important not to miss your dose. Call your health care provider if you are unable to keep an appointment. What may interact with this medication? Do not take this medicine with any of the following medicines: live vaccines This medicine may also interact with the following medicines: cisplatin This list may not describe all possible interactions. Give your health care provider a list of all the  medicines, herbs, non-prescription drugs, or dietary supplements you use. Also tell them if you smoke, drink alcohol, or use illegal drugs. Some items may interact with your medicine. What should I watch for while using this medication? Your condition will be monitored carefully while you are receiving this medicine. You may need blood work done while you are taking this medicine. This medicine can cause serious infusion reactions. To reduce the risk your health care provider may give you other medicines to take before receiving this one. Be sure to follow the directions from your health care provider. This medicine may increase your risk of getting an infection. Call your health care provider for advice if you get a fever, chills, sore throat, or other symptoms of a cold or flu. Do not treat yourself. Try to avoid being around people who are sick. Call your health care provider if you are around anyone with measles, chickenpox, or if you develop sores or blisters that do not heal properly. Avoid taking medicines that contain aspirin, acetaminophen, ibuprofen, naproxen, or ketoprofen unless instructed by your health care provider. These medicines may hide a fever. This medicine may cause serious skin reactions. They can happen weeks to months after starting the medicine. Contact your health care provider right away if you notice fevers or flu-like symptoms with a rash. The rash may be red or purple and then turn into blisters or peeling of the skin. Or, you might notice a red rash with swelling of the face, lips or lymph nodes in your neck or under your arms. In some patients, this medicine may cause a serious brain infection that may cause death. If you have any problems seeing, thinking, speaking, walking, or standing, tell your healthcare professional right away. If you cannot reach your healthcare professional, urgently seek other source of medical care. Do not become pregnant while taking this medicine  or for at least 12 months after stopping it. Women should inform their health care provider if they wish to become pregnant or think they might be pregnant. There is potential for serious harm to an unborn child. Talk to your health care provider for more information. Women should use a reliable form of birth control while taking this medicine and for 12 months after stopping it. Do not breast-feed  while taking this medicine or for at least 6 months after stopping it. What side effects may I notice from receiving this medication? Side effects that you should report to your health care provider as soon as possible: allergic reactions (skin rash, itching or hives; swelling of the face, lips, or tongue) diarrhea edema (sudden weight gain; swelling of the ankles, feet, hands or other unusual swelling; trouble breathing) fast, irregular heartbeat heart attack (trouble breathing; pain or tightness in the chest, neck, back or arms; unusually weak or tired) infection (fever, chills, cough, sore throat, pain or trouble passing urine) kidney injury (trouble passing urine or change in the amount of urine) liver injury (dark yellow or brown urine; general ill feeling or flu-like symptoms; loss of appetite, right upper belly pain; unusually weak or tired, yellowing of the eyes or skin) low blood pressure (dizziness; feeling faint or lightheaded, falls; unusually weak or tired) low red blood cell counts (trouble breathing; feeling faint; lightheaded, falls; unusually weak or tired) mouth sores redness, blistering, peeling, or loosening of the skin, including inside the mouth stomach pain unusual bruising or bleeding wheezing (trouble breathing with loud or whistling sounds) vomiting Side effects that usually do not require medical attention (report to your health care provider if they continue or are bothersome): headache joint pain muscle cramps, pain nausea This list may not describe all possible side  effects. Call your doctor for medical advice about side effects. You may report side effects to FDA at 1-800-FDA-1088. Where should I keep my medication? This medicine is given in a hospital or clinic. It will not be stored at home. NOTE: This sheet is a summary. It may not cover all possible information. If you have questions about this medicine, talk to your doctor, pharmacist, or health care provider.  2022 [employer] (2020-02-02 00:00:00)

## 2021-03-03 NOTE — Progress Notes (Signed)
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  INTERVAL HISTORY: Patient returns to clinic today for further evaluation and to assess his toleration of cycle 1 of R-CHOP.  He reports increased nausea and poor appetite as well as increased weakness and fatigue.  He states his pain is improved, but his wife reports increasing narcotic use.  He denies any night sweats, fevers, or weight loss.  He has no neurologic complaints.  He has no chest pain, shortness of breath, cough, or hemoptysis.  He denies any constipation or diarrhea.  He has no urinary complaints.  Patient feels generally terrible, but offers no further specific complaints today.  REVIEW OF SYSTEMS:   Review of Systems  Constitutional:  Positive for malaise/fatigue. Negative for fever and weight loss.  Respiratory: Negative.  Negative for cough, hemoptysis and shortness of breath.   Cardiovascular: Negative.  Negative for chest pain and leg swelling.  Gastrointestinal:  Positive for nausea. Negative for abdominal pain.  Genitourinary: Negative.  Negative for dysuria.  Musculoskeletal:  Positive for back pain.  Skin: Negative.  Negative for rash.  Neurological:  Positive for weakness. Negative for dizziness, focal weakness and headaches.  Psychiatric/Behavioral: Negative.  The patient is not nervous/anxious.    As per HPI. Otherwise, a complete review of systems is negative.  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   a.)  s/p RIGHT sided PTA and stenting   MDD (major depressive disorder)    Myocardial infarction ([place]) 02/21/2006   Neuropathy    OSA (obstructive sleep apnea)    Peripheral vascular disease (HCC)    Postlaminectomy syndrome of lumbar region 09/21/2011   Postoperative atrial fibrillation (HCC)    Statin intolerance    Thoracic aortic aneurysm    s/p dissection and repair in 08/2012    PAST SURGICAL HISTORY: Past Surgical History:  Procedure Laterality Date   ANGIOPLASTY / STENTING ILIAC Right 04/03/2006   Procedure(s): abdominal aortography, bilateral lower extremity runoff, iliac PTA and stenting; Location: [facility]; Surgeon: Dionne Ano, MD   BLEPHAROPLASTY Bilateral 05/06/2019   Procedure: BLEPHAROPLASTY, UPPER EYELID; WITH EXCESSIVE SKIN WEIGHTING DOWN LID; Surgeon: Lucilla Lame, MD; Location: [place]; Service: Ophthalmology; Laterality: Bilateral   CARDIAC CATHETERIZATION N/A 03/15/2006   Location: [facility]; Surgeon: Samella Parr, MD   CARDIAC CATHETERIZATION N/A 03/23/2011   Location: [facility]; Surgeon: Kathe Mariner, MD   CATARACT EXTRACTION W/ INTRAOCULAR LENS IMPLANT Right 03/04/2019   Procedure: EXTRACAPSULAR CATARACT PHACO REMOVAL WITH INSERTION OF INTRAOCULAR LENS PROSTHESIS (1 STAGE PROCEDURE), MANUAL OR MECHANICAL TECHNIQUE; Surgeon: Christean Grief, MD; Location: [facility]; Service: Ophthalmology; Laterality: Right; incision approximated   CATARACT EXTRACTION W/ INTRAOCULAR LENS IMPLANT Left 02/18/2019   Procedure: DEXTENZA- EXTRACAPSULAR CATARACT ECCE REMOVAL WITH INSERTION OF INTRAOCULAR LENS PROSTHESIS (1 STAGE PROCEDURE), MANUAL OR MECHANICAL TECHNIQUE; Surgeon: Christean Grief, MD; Location:  [facility]; Service: Ophthalmology; Laterality: Left   COLONOSCOPY     CORONARY ARTERY BYPASS GRAFT N/A 03/29/2011   4v; LIMA-LAD, SVG-RI, SVG-D1, SVG-RCA   ELECTROCONVULSIVE THERAPY N/A    Multiple  treatments   FRACTURE SURGERY     IR IMAGING GUIDED PORT INSERTION  02/11/2021   LUMBAR LAMINECTOMY N/A 02/2004   L4-L5 and L5-S1   PERCUTANEOUS CORONARY STENT INTERVENTION (PCI-S) N/A 02/21/2006   Procedure: PCI with 3.5 x 16 mm Liberte BMS x 1 to RCA; Location: [facility]; Surgeon: Dionne Ano, MD   PERCUTANEOUS CORONARY STENT INTERVENTION (PCI-S) N/A 09/18/2006   Procedure: overlapping 3.5 x 18 mm Xience DES to in stent of BMS in RCA; Location: [facility]; Surgeon: Lovena Neighbours, MD   REPAIR OF ACUTE ASCENDING THORACIC AORTIC DISSECTION N/A 08/14/2012   Redo sternotomy for repair Type A dissection with ascending aortic graft, aortic valve resuspension, aortic valve central plication of non-coronary cusp, STJ remodeling; Location: [facility]; Surgeon: Dr. Cheree Ditto   REVISION TOTAL KNEE ARTHROPLASTY Right 11/2009   TONSILLECTOMY     TOTAL HIP ARTHROPLASTY Right 08/24/2020   Procedure: TOTAL HIP ARTHROPLASTY ANTERIOR APPROACH;  Surgeon: Hessie Knows, MD;  Location: [facility];  Service: Orthopedics;  Laterality: Right;   TOTAL KNEE ARTHROPLASTY Right 02/2008    FAMILY HISTORY: History reviewed. No pertinent family history.  ADVANCED DIRECTIVES (Y/N):  N  HEALTH MAINTENANCE: Social History   Tobacco Use   Smoking status: Former    Types: Cigars    Quit date: 03/19/2011    Years since quitting: 9.9   Smokeless tobacco: Never  Vaping Use   Vaping Use: Never used  Substance Use Topics   Alcohol use: Not Currently   Drug use: Never     Colonoscopy:  PAP:  Bone density:  Lipid panel:  Allergies  Allergen Reactions   Vortioxetine Other (See Comments)    Serotonin syndrome     Atorvastatin Other (See Comments)     Muscle Pain   Bupropion Other (See Comments)    vision problem     Morphine Nausea Only and Other (See Comments)    "dreams"    Pravastatin Other (See Comments)     Muscle Pain    Rosuvastatin Other (See Comments)    Muscle Pain   Felodipine Other (See Comments)     sexual  dysfunction     Hydrochlorothiazide W-Triamterene Other (See Comments)    leg cramps     Lisinopril Other (See Comments)     jitteriness     Metoprolol Other (See Comments)    sexual dys, abn tongue sens.      Current Outpatient Medications  Medication Sig Dispense Refill   allopurinol (ZYLOPRIM) 300 MG tablet Take 1 tablet (300 mg total) by mouth daily. 30 tablet 3   amphetamine-dextroamphetamine (ADDERALL) 20 MG tablet Take 20 mg by mouth 2 (two) times daily.     AUSTEDO 9 MG TABS Take 9 mg by mouth 2 (two) times daily.     benazepril (LOTENSIN) 20 MG tablet Take 20 mg by mouth at bedtime.     celecoxib (CELEBREX) 200 MG capsule Take 1 capsule by mouth 2 (two) times daily.     cetirizine (ZYRTEC) 10 MG tablet Take 10 mg by mouth daily as needed for allergies.     Cholecalciferol (VITAMIN D3 SUPER STRENGTH) 50 MCG (2000 UT) TABS Take 2,000 Units by mouth daily.     dexamethasone (DECADRON) 4 MG tablet Take 1 tablet (4 mg total) by mouth daily.  30 tablet 3   escitalopram (LEXAPRO) 20 MG tablet Take 20 mg by mouth at bedtime.     fluticasone (FLONASE) 50 MCG/ACT nasal spray Place 2 sprays into the nose daily.     ibuprofen (ADVIL) 200 MG tablet Take 800 mg by mouth daily as needed.     LATUDA 40 MG TABS tablet Take 40 mg by mouth at bedtime.     lidocaine-prilocaine (EMLA) cream Apply to affected area once 30 g 3   Multiple Vitamins-Minerals (MULTIVITAMIN WITH MINERALS) tablet Take 1 tablet by mouth daily.     ondansetron (ZOFRAN) 8 MG tablet Take 1 tablet (8 mg total) by mouth 2 (two) times daily as needed for refractory nausea / vomiting. 60 tablet 2   oxyCODONE (OXY IR/ROXICODONE) 5 MG immediate release tablet Take 1-2 tablets (5-10 mg total) by mouth every 4 (four) hours as needed for severe pain. 60 tablet 0   pantoprazole (PROTONIX) 40 MG tablet Take 40 mg by mouth daily.     polyethylene glycol (MIRALAX / GLYCOLAX) 17 g packet Take 17 g by mouth daily. 14 each 1    predniSONE (DELTASONE) 20 MG tablet Take 5 tablets (100 mg total) by mouth daily. Take with food on days 1-5 of chemotherapy. 25 tablet 5   prochlorperazine (COMPAZINE) 10 MG tablet Take 1 tablet (10 mg total) by mouth every 6 (six) hours as needed (Nausea or vomiting). 60 tablet 2   SYMBICORT 80-4.5 MCG/ACT inhaler Inhale 2 puffs into the lungs 2 (two) times daily.     testosterone cypionate (DEPOTESTOSTERONE CYPIONATE) 200 MG/ML injection Inject 200 mg into the muscle every 14 (fourteen) days.     TIADYLT ER 360 MG 24 hr capsule Take 360 mg by mouth at bedtime.     VASCEPA 1 g capsule Take 2 g by mouth 2 (two) times daily.     traMADol (ULTRAM) 50 MG tablet Take 1 tablet (50 mg total) by mouth every 6 (six) hours. (Patient not taking: Reported on 02/22/2021) 30 tablet 0   No current facility-administered medications for this visit.    OBJECTIVE: Vitals:   03/10/21 1041  BP: 122/78  Pulse: (!) 104  Resp: 20  Temp: 99.3 F (37.4 C)  SpO2: 100%     Body mass index is 22.3 kg/m.    ECOG FS:1 - Symptomatic but completely ambulatory  General: Ill-appearing, no acute distress. Eyes: Pink conjunctiva, anicteric sclera. HEENT: Normocephalic, moist mucous membranes. Lungs: No audible wheezing or coughing. Heart: Regular rate and rhythm. Abdomen: Soft, nontender, no obvious distention. Musculoskeletal: No edema, cyanosis, or clubbing. Neuro: Alert, answering all questions appropriately. Cranial nerves grossly intact. Skin: No rashes or petechiae noted. Psych: Normal affect.   LAB RESULTS:  Lab Results  Component Value Date   NA 137 03/10/2021   K 4.0 03/10/2021   CL 102 03/10/2021   CO2 21 (L) 03/10/2021   GLUCOSE 146 (H) 03/10/2021   BUN 20 03/10/2021   CREATININE 0.79 03/10/2021   CALCIUM 8.6 (L) 03/10/2021   PROT 6.1 (L) 03/10/2021   ALBUMIN 3.2 (L) 03/10/2021   AST 33 03/10/2021   ALT 46 (H) 03/10/2021   ALKPHOS 62 03/10/2021   BILITOT 1.5 (H) 03/10/2021   GFRNONAA >60  03/10/2021    Lab Results  Component Value Date   WBC 1.2 (LL) 03/10/2021   NEUTROABS 0.1 (LL) 03/10/2021   HGB 13.9 03/10/2021   HCT 41.9 03/10/2021   MCV 92.9 03/10/2021   PLT 144 (L) 03/10/2021  STUDIES: CT ABDOMEN PELVIS W CONTRAST  Result Date: 02/19/2021 CLINICAL DATA:  Nausea and vomiting. Diarrhea and decreased appetite. EXAM: CT ABDOMEN AND PELVIS WITH CONTRAST TECHNIQUE: Multidetector CT imaging of the abdomen and pelvis was performed using the standard protocol following bolus administration of intravenous contrast. CONTRAST:  155m OMNIPAQUE IOHEXOL 300 MG/ML  SOLN COMPARISON:  PET CT 3 days ago.  Chest abdomen pelvis CT 02/02/2021 FINDINGS: Lower chest: Subsegmental atelectasis or scar in the right lower lobe. No pleural effusion or acute airspace disease. Unchanged hiatal hernia. Hepatobiliary: Focal fatty infiltration adjacent to the falciform ligament. No focal liver lesion. Mild gallbladder distention without calcified gallstone or pericholecystic fat stranding. No biliary dilatation. Pancreas: No ductal dilatation or inflammation. Spleen: Normal in size without focal abnormality. Adrenals/Urinary Tract: Normal adrenal glands. Lobulated bilateral renal contours without hydronephrosis. No perinephric edema. Homogeneous renal enhancement with symmetric excretion on delayed phase imaging. No focal renal abnormality or visualized stone. Unremarkable urinary bladder. Stomach/Bowel: Moderate hiatal hernia. Small duodenal diverticulum. No small bowel obstruction or abnormal distention. Occasional fluid-filled distal small bowel without perienteric fat stranding. High-riding cecum in the right mid abdomen. The appendix is normal. Moderate volume of stool throughout the colon. Colonic diverticulosis. No diverticulitis or acute colonic inflammation. Vascular/Lymphatic: Moderately advanced aortic atherosclerosis. Bi-iliac tortuosity. Patent portal and splenic veins. No acute vascular  findings. No enlarged abdominopelvic lymph nodes. Reproductive: Unremarkable prostate gland. Other: No ascites, free air or focal fluid collection. Tiny fat containing umbilical hernia. No omental thickening. Musculoskeletal: Expansile right sacral lesion which has been recently assessed with PET. Ghost tracks within L5-S1. Diffuse lumbar degenerative change. Right hip arthroplasty IMPRESSION: 1. No acute abnormality in the abdomen/pelvis. 2. Moderate volume of stool throughout the colon, can be seen with constipation. Colonic diverticulosis without diverticulitis. 3. Moderate hiatal hernia. 4. Known right sacral lesion is unchanged in the short interim from prior imaging. Aortic Atherosclerosis (ICD10-I70.0). Electronically Signed   By: MKeith RakeM.D.   On: 02/19/2021 22:19   NM PET Image Initial (PI) Skull Base To Thigh  Result Date: 02/16/2021 CLINICAL DATA:  Subsequent treatment strategy for sacral mass, lymphoma. EXAM: NUCLEAR MEDICINE PET SKULL BASE TO THIGH TECHNIQUE: 9.1 mCi F-18 FDG was injected intravenously. Full-ring PET imaging was performed from the skull base to thigh after the radiotracer. CT data was obtained and used for attenuation correction and anatomic localization. Fasting blood glucose: 139 mg/dl COMPARISON:  02/08/2021 and CT chest abdomen pelvis 02/02/2021. FINDINGS: Mediastinal blood pool activity: SUV max 1.5 Liver activity: SUV max 2.8 NECK: No hypermetabolic lymph nodes. Incidental CT findings: None. CHEST: No abnormal hypermetabolism. Incidental CT findings: Atherosclerotic calcification of the aorta and aortic valve. Ascending aorta measures up to 4.4 cm, stable. No pericardial or pleural effusion. Moderate hiatal hernia. ABDOMEN/PELVIS: No abnormal hypermetabolism in the liver, adrenal glands, spleen or pancreas. No hypermetabolic lymph nodes. Incidental CT findings: Liver, gallbladder, adrenal glands, kidneys, spleen and pancreas are unremarkable. Moderate hiatal hernia.  Duodenal diverticulum. Small bowel, appendix and colon are otherwise unremarkable. Atherosclerotic calcification of the aorta. SKELETON: An expansile lytic mass in the right sacrum extends into the right sciatic region with an SUV max 14.2 superiorly and 17.1 inferiorly. Overall size appears grossly stable in the very short interval from 02/08/2021. No additional abnormal osseous hypermetabolism. Incidental CT findings: Right hip arthroplasty.  Degenerative changes in the spine. IMPRESSION: 1. Hypermetabolic lytic right sacral mass with extension into the right sciatic region, stable in size from 02/08/2021 and compatible with the provided history  of lymphoma. No evidence of distant disease. 2. Ascending aortic aneurysm as before. Recommend annual imaging followup by CTA or MRA. This recommendation follows 2010 ACCF/AHA/AATS/ACR/ASA/SCA/SCAI/SIR/STS/SVM Guidelines for the Diagnosis and Management of Patients with Thoracic Aortic Disease. Circulation. 2010; 121: J884-Z660. Aortic aneurysm NOS (ICD10-I71.9) 3.  Aortic atherosclerosis (ICD10-I70.0). Electronically Signed   By: Lorin Picket M.D.   On: 02/16/2021 15:39   DG Chest Portable 1 View  Result Date: 02/19/2021 CLINICAL DATA:  Evaluate for infiltrate. EXAM: PORTABLE CHEST 1 VIEW COMPARISON:  CT dated 02/02/2021. FINDINGS: Right-sided Port-A-Cath with tip over central SVC. Minimal bibasilar atelectasis. No focal consolidation, pleural effusion, pneumothorax. The cardiac silhouette is within limits. Atherosclerotic calcification of the aorta. Median sternotomy wires and CABG vascular clips. No acute osseous pathology. IMPRESSION: No active disease. Electronically Signed   By: Anner Crete M.D.   On: 02/19/2021 21:43   ECHOCARDIOGRAM COMPLETE  Result Date: 02/15/2021    ECHOCARDIOGRAM REPORT   Patient Name:   JAYVIAN ESCOE Date of Exam: 02/15/2021 Medical Rec #:  [id number]      Height:       69.0 in Accession #:    [id number]     Weight:       163.0 lb  Date of Birth:  09/25/1945       BSA:          1.894 m Patient Age:    36 years       BP:           163/84 mmHg Patient Gender: M              HR:           82 bpm. Exam Location:  [facility] Procedure: 2D Echo, Cardiac Doppler and Color Doppler Indications:     Chemo Z09  History:         Patient has no prior history of Echocardiogram examinations.                  Previous Myocardial Infarction and CAD, Prior CABG; Risk                  Factors:Hypertension and Dyslipidemia.  Sonographer:     Sherrie Sport Referring Phys:  [id number] Lloyd Huger Diagnosing Phys: Nelva Bush MD  Sonographer Comments: Suboptimal apical window and suboptimal subcostal window. IMPRESSIONS  1. Left ventricular ejection fraction, by estimation, is 60 to 65%. The left ventricle has normal function. The left ventricle has no regional wall motion abnormalities. The left ventricular internal cavity size was mildly dilated. Left ventricular diastolic parameters are consistent with Grade I diastolic dysfunction (impaired relaxation).  2. Right ventricular systolic function is mildly reduced. The right ventricular size is normal. Mildly increased right ventricular wall thickness.  3. The mitral valve is grossly normal. No evidence of mitral valve regurgitation. No evidence of mitral stenosis.  4. The aortic valve is tricuspid. There is mild calcification of the aortic valve. There is mild thickening of the aortic valve. Aortic valve regurgitation is mild.  5. Aortic dilatation noted. 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 FINDINGS  Left Ventricle: Left ventricular ejection fraction, by estimation, is 60 to 65%. The left ventricle has normal function. The left ventricle has no regional wall motion abnormalities. The left ventricular internal cavity size was mildly dilated. There is  no left ventricular hypertrophy. Abnormal (paradoxical) septal motion consistent with post-operative status. Left ventricular diastolic  parameters are consistent with Grade  I diastolic dysfunction (impaired relaxation). Right Ventricle: The right ventricular size is normal. Mildly increased right ventricular wall thickness. Right ventricular systolic function is mildly reduced. Left Atrium: Left atrial size was normal in size. Right Atrium: Right atrial size was normal in size. Pericardium: There is no evidence of pericardial effusion. Mitral Valve: The mitral valve is grossly normal. No evidence of mitral valve regurgitation. No evidence of mitral valve stenosis. MV peak gradient, 2.4 mmHg. The mean mitral valve gradient is 1.0 mmHg. Tricuspid Valve: The tricuspid valve is not well visualized. Tricuspid valve regurgitation is mild. Aortic Valve: The aortic valve is tricuspid. There is mild calcification of the aortic valve. There is mild thickening of the aortic valve. Aortic valve regurgitation is mild. Aortic valve mean gradient measures 2.0 mmHg. Aortic valve peak gradient measures 3.6 mmHg. Aortic valve area, by VTI measures 4.86 cm. Pulmonic Valve: The pulmonic valve was not well visualized. Pulmonic valve regurgitation is not visualized. No evidence of pulmonic stenosis. Aorta: Aortic dilatation noted. 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 Pulmonary Artery: The pulmonary artery is of normal size. IAS/Shunts: The interatrial septum was not well visualized.  LEFT VENTRICLE PLAX 2D LVIDd:         3.70 cm   Diastology LVIDs:         2.20 cm   LV e' medial:    5.77 cm/s LV PW:         1.30 cm   LV E/e' medial:  8.7 LV IVS:        1.00 cm   LV e' lateral:   14.30 cm/s LVOT diam:     2.20 cm   LV E/e' lateral: 3.5 LV SV:         69 LV SV Index:   36 LVOT Area:     3.80 cm  RIGHT VENTRICLE RV Basal diam:  4.00 cm RV S prime:     9.79 cm/s TAPSE (M-mode): 1.2 cm LEFT ATRIUM             Index        RIGHT ATRIUM           Index LA diam:        1.90 cm 1.00 cm/m   RA Area:     12.90 cm LA Vol (A2C):   29.4 ml 15.52 ml/m  RA  Volume:   30.70 ml  16.21 ml/m LA Vol (A4C):   15.3 ml 8.08 ml/m LA Biplane Vol: 22.2 ml 11.72 ml/m  AORTIC VALVE                    PULMONIC VALVE AV Area (Vmax):    3.75 cm     PV Vmax:        0.67 m/s AV Area (Vmean):   4.06 cm     PV Vmean:       45.400 cm/s AV Area (VTI):     4.86 cm     PV VTI:         0.097 m AV Vmax:           95.40 cm/s   PV Peak grad:   1.8 mmHg AV Vmean:          65.700 cm/s  PV Mean grad:   1.0 mmHg AV VTI:            0.142 m      RVOT Peak grad: 2 mmHg AV Peak Grad:  3.6 mmHg AV Mean Grad:      2.0 mmHg LVOT Vmax:         94.00 cm/s LVOT Vmean:        70.200 cm/s LVOT VTI:          0.181 m LVOT/AV VTI ratio: 1.28  AORTA Ao Root diam: 4.10 cm MITRAL VALVE               TRICUSPID VALVE MV Area (PHT): 2.43 cm    TR Peak grad:   14.6 mmHg MV Area VTI:   3.98 cm    TR Vmax:        191.00 cm/s MV Peak grad:  2.4 mmHg MV Mean grad:  1.0 mmHg    SHUNTS MV Vmax:       0.78 m/s    Systemic VTI:  0.18 m MV Vmean:      40.9 cm/s   Systemic Diam: 2.20 cm MV Decel Time: 312 msec    Pulmonic VTI:  0.081 m MV E velocity: 50.10 cm/s MV A velocity: 56.10 cm/s MV E/A ratio:  0.89 Christopher End MD Electronically signed by Nelva Bush MD Signature Date/Time: 02/15/2021/9:47:17 AM    Final    IR IMAGING GUIDED PORT INSERTION  Result Date: 02/11/2021 INDICATION: b cell lymphoma EXAM: 1. Ultrasound-guided puncture of the right internal jugular vein 2. Placement of a right-sided chest port using fluoroscopic guidance MEDICATIONS: None ANESTHESIA/SEDATION: Moderate (conscious) sedation was employed during this procedure. A total of Versed 2 mg and Fentanyl 100 mcg was administered intravenously. Moderate Sedation Time: 26 minutes. The patient's level of consciousness and vital signs were monitored continuously by radiology nursing throughout the procedure under my direct supervision. FLUOROSCOPY TIME:  Fluoroscopy Time: 0.7 minutes with 3 exposures COMPLICATIONS: None immediate. PROCEDURE:  Informed written consent was obtained from the patient after a thorough discussion of the procedural risks, benefits and alternatives. All questions were addressed. Maximal Sterile Barrier Technique was utilized including caps, mask, sterile gowns, sterile gloves, sterile drape, hand hygiene and skin antiseptic. A timeout was performed prior to the initiation of the procedure. The patient was placed supine on the exam table. The right neck and chest was prepped and draped in the standard sterile fashion. A preliminary ultrasound of the right neck was performed and demonstrates a patent right internal jugular vein. A permanent ultrasound image was stored in the electronic medical record. The overlying skin was anesthetized with 1% Lidocaine. Using ultrasound guidance, access was obtained into the right internal jugular vein using a 21 gauge micropuncture set. A wire was advanced into the SVC, a short incision was made at the puncture site, and serial dilatation performed. Next, in an ipsilateral infraclavicular location, an incision was made at the site of the subcutaneous reservoir. Blunt dissection was used to open a pocket to contain the reservoir. A subcutaneous tunnel was then created from the port site to the puncture site. A(n) 8 Fr single lumen catheter was advanced through the tunnel. The catheter was attached to the port and this was placed in the subcutaneous pocket. Under fluoroscopic guidance, a peel away sheath was placed, and the catheter was trimmed to the appropriate length and was advanced into the central veins. The catheter length is 26 cm. The tip of the catheter lies near the superior cavoatrial junction. The port flushes and aspirates appropriately. The port was flushed and locked with heparinized saline. The port pocket was closed in 2 layers using 3-0 and 4-0 Vicryl/absorbable suture. Dermabond  was also applied to both incisions. The patient tolerated the procedure well and was transferred to  recovery in stable condition. IMPRESSION: Successful placement of a right chest port via the right internal jugular vein. The port is ready for immediate use. Electronically Signed   By: Albin Felling M.D.   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  There is also some enhancing tumor in the right S1 neuroforamen displacing the nerve root anteriorly.  Biopsy confirmed diagnosis.  PET scan and bone marrow biopsy did not reveal any distant disease.  Spinal fluid was negative for malignancy.  Patient has had port placement.  Echo revealed EF of 65% and adequate to proceed with treatment.  Patient will likely receive 4 cycles of R-CHOP chemotherapy with Udenyca support every 3 weeks and then referral to radiation oncology for adjuvant XRT given the bulky nature of patient's disease.  Patient received cycle 1 of R-CHOP last week.  He now has decreased performance status, appetite, and increased nausea.  He received 1 L of IV fluids today and tomorrow in clinic.  Return to clinic in 1 week for laboratory work, further evaluation, and consideration of IV fluids, and then in 2 weeks as previously scheduled for consideration of cycle 2.   2.  Back pain: Difficult to assess.  Patient reports improvement, but wife states he is using increased narcotics.  Will schedule appointment with palliative care in the near future. 3.  Neutropenia: Secondary to treatment.  Patient received Udenyca.   4.  Thrombocytosis: Resolved. 5.  Elevated bilirubin and ALT: Mild, possibly secondary to treatment.  Monitor.    Patient expressed understanding and was in agreement with this plan. He also understands that He can call clinic at any time with any questions, concerns, or complaints.    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

## 2021-03-03 NOTE — Telephone Encounter (Signed)
Received call from Pt wife with concerns regarding pain medication. She feels like he is taking too much of the pain medications. She states that he sleeps the majority of the day but gets "pissy" when he doesn't take the medication. Pt wife states he called her to tell her that he took 2 oxycodone during chemo. She is asking about the possibility of fentanyl patch.

## 2021-03-04 ENCOUNTER — Telehealth: Payer: Self-pay | Admitting: *Deleted

## 2021-03-04 ENCOUNTER — Inpatient Hospital Stay: Payer: Medicare Other

## 2021-03-04 DIAGNOSIS — Z5112 Encounter for antineoplastic immunotherapy: Secondary | ICD-10-CM | POA: Diagnosis not present

## 2021-03-04 DIAGNOSIS — C8510 Unspecified B-cell lymphoma, unspecified site: Secondary | ICD-10-CM

## 2021-03-04 MED ORDER — PEGFILGRASTIM-CBQV 6 MG/0.6ML ~~LOC~~ SOSY
6.0000 mg | PREFILLED_SYRINGE | Freq: Once | SUBCUTANEOUS | Status: AC
  Administered 2021-03-04: 6 mg via SUBCUTANEOUS
  Filled 2021-03-04: qty 0.6

## 2021-03-04 NOTE — Telephone Encounter (Signed)
One 03/04/21 at 218 pm. I called patient s/p new start chemotherapy treatment (RCHOP) (03/03/21) Contacted patient via home phone- no answer. Left msg- requesting pt to return my phone. I contacted the patient's wife. Patient was not currently home and was attending to an errand.  I spoke wife, Jordan Mason, who stated that the her husband is doing well. He is taking his prednisone as directed. He experienced one episode this morning of nausea. The nausea resolved after patient took his Zofran. Pt has experienced mild fatigue - but at pt's baseline. Wife stated that she has been reviewing the chemotherapy resource book. She has no specific questions at this time. Wife was reminded of the cancer center resources, supportive services including apts in [facility]/dietary consults. Wife gave verbal understanding of the plan of care. Her husband will keep the apt today as planned for the [facility]. Wife thanked me for calling her. I offered to help pt/wife activate the mychart acct. She stated that her husband specifically wanted to do this himself. Discussed having her husband add wife as proxy to the [place]. She will try to get the mychart set up this weekend.

## 2021-03-10 ENCOUNTER — Inpatient Hospital Stay: Payer: Medicare Other

## 2021-03-10 ENCOUNTER — Inpatient Hospital Stay (HOSPITAL_BASED_OUTPATIENT_CLINIC_OR_DEPARTMENT_OTHER): Payer: Medicare Other | Admitting: Oncology

## 2021-03-10 ENCOUNTER — Encounter: Payer: Self-pay | Admitting: Oncology

## 2021-03-10 ENCOUNTER — Other Ambulatory Visit: Payer: Self-pay

## 2021-03-10 VITALS — BP 122/78 | HR 104 | Temp 99.3°F | Resp 20 | Ht 69.0 in | Wt 151.0 lb

## 2021-03-10 DIAGNOSIS — C8510 Unspecified B-cell lymphoma, unspecified site: Secondary | ICD-10-CM

## 2021-03-10 DIAGNOSIS — E860 Dehydration: Secondary | ICD-10-CM

## 2021-03-10 DIAGNOSIS — Z5112 Encounter for antineoplastic immunotherapy: Secondary | ICD-10-CM | POA: Diagnosis not present

## 2021-03-10 LAB — COMPREHENSIVE METABOLIC PANEL
ALT: 46 U/L — ABNORMAL HIGH (ref 0–44)
AST: 33 U/L (ref 15–41)
Albumin: 3.2 g/dL — ABNORMAL LOW (ref 3.5–5.0)
Alkaline Phosphatase: 62 U/L (ref 38–126)
Anion gap: 14 (ref 5–15)
BUN: 20 mg/dL (ref 8–23)
CO2: 21 mmol/L — ABNORMAL LOW (ref 22–32)
Calcium: 8.6 mg/dL — ABNORMAL LOW (ref 8.9–10.3)
Chloride: 102 mmol/L (ref 98–111)
Creatinine, Ser: 0.79 mg/dL (ref 0.61–1.24)
GFR, Estimated: >60 mL/min (ref >60)
Glucose, Bld: 146 mg/dL — ABNORMAL HIGH (ref 70–99)
Potassium: 4 mmol/L (ref 3.5–5.1)
Sodium: 137 mmol/L (ref 135–145)
Total Bilirubin: 1.5 mg/dL — ABNORMAL HIGH (ref 0.3–1.2)
Total Protein: 6.1 g/dL — ABNORMAL LOW (ref 6.5–8.1)

## 2021-03-10 LAB — CBC WITH DIFFERENTIAL/PLATELET
Abs Immature Granulocytes: 0 10*3/uL (ref 0.00–0.07)
Basophils Absolute: 0 10*3/uL (ref 0.0–0.1)
Basophils Relative: 1 %
Eosinophils Absolute: 0 10*3/uL (ref 0.0–0.5)
Eosinophils Relative: 3 %
HCT: 41.9 % (ref 39.0–52.0)
Hemoglobin: 13.9 g/dL (ref 13.0–17.0)
Immature Granulocytes: 0 %
Lymphocytes Relative: 87 %
Lymphs Abs: 1 10*3/uL (ref 0.7–4.0)
MCH: 30.8 pg (ref 26.0–34.0)
MCHC: 33.2 g/dL (ref 30.0–36.0)
MCV: 92.9 fL (ref 80.0–100.0)
Monocytes Absolute: 0.1 10*3/uL (ref 0.1–1.0)
Monocytes Relative: 4 %
Neutro Abs: 0.1 10*3/uL — CL (ref 1.7–7.7)
Neutrophils Relative %: 5 %
Platelets: 144 10*3/uL — ABNORMAL LOW (ref 150–400)
RBC: 4.51 MIL/uL (ref 4.22–5.81)
RDW: 13.5 % (ref 11.5–15.5)
WBC: 1.2 10*3/uL — CL (ref 4.0–10.5)
nRBC: 0 % (ref 0.0–0.2)

## 2021-03-10 MED ORDER — HEPARIN SOD (PORK) LOCK FLUSH 100 UNIT/ML IV SOLN
500.0000 [IU] | Freq: Once | INTRAVENOUS | Status: AC
  Administered 2021-03-10: 500 [IU] via INTRAVENOUS
  Filled 2021-03-10: qty 5

## 2021-03-10 MED ORDER — SODIUM CHLORIDE 0.9 % IV SOLN
Freq: Once | INTRAVENOUS | Status: AC
  Filled 2021-03-10: qty 250

## 2021-03-10 MED ORDER — SODIUM CHLORIDE 0.9 % IV SOLN: 8.0000 mg | Freq: Once | INTRAVENOUS | Status: DC

## 2021-03-10 MED ORDER — ONDANSETRON HCL 4 MG/2ML IJ SOLN
8.0000 mg | Freq: Once | INTRAMUSCULAR | Status: AC
  Administered 2021-03-10: 8 mg via INTRAVENOUS
  Filled 2021-03-10: qty 4

## 2021-03-10 NOTE — Progress Notes (Signed)
IVFs and nausea medications given. Port will remain accessed overnight as he will return to clinic tomorrow. Accompanied by wife to home.

## 2021-03-10 NOTE — Patient Instructions (Signed)
[facility] AT [place]  Discharge Instructions: Thank you for choosing [facility] to provide your oncology and hematology care.  If you have a lab appointment with the [facility], please go directly to the [facility] and check in at the registration area.  Wear comfortable clothing and clothing appropriate for easy access to any Portacath or PICC line.   We strive to give you quality time with your provider. You may need to reschedule your appointment if you arrive late (15 or more minutes).  Arriving late affects you and other patients whose appointments are after yours.  Also, if you miss three or more appointments without notifying the office, you may be dismissed from the clinic at the providers discretion.      For prescription refill requests, have your pharmacy contact our office and allow 72 hours for refills to be completed.    Today you received the following chemotherapy and/or immunotherapy agents HYDRATION and ZOFRAN      To help prevent nausea and vomiting after your treatment, we encourage you to take your nausea medication as directed.  BELOW ARE SYMPTOMS THAT SHOULD BE REPORTED IMMEDIATELY: *FEVER GREATER THAN 100.4 F (38 C) OR HIGHER *CHILLS OR SWEATING *NAUSEA AND VOMITING THAT IS NOT CONTROLLED WITH YOUR NAUSEA MEDICATION *UNUSUAL SHORTNESS OF BREATH *UNUSUAL BRUISING OR BLEEDING *URINARY PROBLEMS (pain or burning when urinating, or frequent urination) *BOWEL PROBLEMS (unusual diarrhea, constipation, pain near the anus) TENDERNESS IN MOUTH AND THROAT WITH OR WITHOUT PRESENCE OF ULCERS (sore throat, sores in mouth, or a toothache) UNUSUAL RASH, SWELLING OR PAIN  UNUSUAL VAGINAL DISCHARGE OR ITCHING   Items with * indicate a potential emergency and should be followed up as soon as possible or go to the Emergency Department if any problems should occur.  Please show the CHEMOTHERAPY ALERT CARD or IMMUNOTHERAPY ALERT CARD at  check-in to the Emergency Department and triage nurse.  Should you have questions after your visit or need to cancel or reschedule your appointment, please contact [facility] CANCER [place] AT [place]  [phone] and follow the prompts.  Office hours are 8:00 a.m. to 4:30 p.m. Monday - Friday. Please note that voicemails left after 4:00 p.m. may not be returned until the following business day.  We are closed weekends and major holidays. You have access to a nurse at all times for urgent questions. Please call the main number to the clinic [phone] and follow the prompts.  For any non-urgent questions, you may also contact your provider using MyChart. We now offer e-Visits for anyone 66 and older to request care online for non-urgent symptoms. For details visit [email or web address].   Also download the MyChart app! Go to the app store, search "MyChart", open the app, select [facility], and log in with your MyChart username and password.  Due to Covid, a mask is required upon entering the hospital/clinic. If you do not have a mask, one will be given to you upon arrival. For doctor visits, patients may have 1 support person aged 84 or older with them. For treatment visits, patients cannot have anyone with them due to current Covid guidelines and our immunocompromised population.   Dehydration, Adult Dehydration is condition in which there is not enough water or other fluids in the body. This happens when a person loses more fluids than he or she takes in. Important body parts cannot work right without the right amount of fluids. Any loss of fluids from the body can cause  dehydration. Dehydration can be mild, worse, or very bad. It should be treated right away to keep it from getting very bad. What are the causes? This condition may be caused by: Conditions that cause loss of water or other fluids, such as: Watery poop (diarrhea). Vomiting. Sweating a lot. Peeing (urinating) a  lot. Not drinking enough fluids, especially when you: Are ill. Are doing things that take a lot of energy to do. Other illnesses and conditions, such as fever or infection. Certain medicines, such as medicines that take extra fluid out of the body (diuretics). Lack of safe drinking water. Not being able to get enough water and food. What increases the risk? The following factors may make you more likely to develop this condition: Having a long-term (chronic) illness that has not been treated the right way, such as: Diabetes. Heart disease. Kidney disease. Being 35 years of age or older. Having a disability. Living in a place that is high above the ground or sea (high in altitude). The thinner, dried air causes more fluid loss. Doing exercises that put stress on your body for a long time. What are the signs or symptoms? Symptoms of dehydration depend on how bad it is. Mild or worse dehydration Thirst. Dry lips or dry mouth. Feeling dizzy or light-headed, especially when you stand up from sitting. Muscle cramps. Your body making: Dark pee (urine). Pee may be the color of tea. Less pee than normal. Less tears than normal. Headache. Very bad dehydration Changes in skin. Skin may: Be cold to the touch (clammy). Be blotchy or pale. Not go back to normal right after you lightly pinch it and let it go. Little or no tears, pee, or sweat. Changes in vital signs, such as: Fast breathing. Low blood pressure. Weak pulse. Pulse that is more than 100 beats a minute when you are sitting still. Other changes, such as: Feeling very thirsty. Eyes that look hollow (sunken). Cold hands and feet. Being mixed up (confused). Being very tired (lethargic) or having trouble waking from sleep. Short-term weight loss. Loss of consciousness. How is this treated? Treatment for this condition depends on how bad it is. Treatment should start right away. Do not wait until your condition gets very  bad. Very bad dehydration is an emergency. You will need to go to a hospital. Mild or worse dehydration can be treated at home. You may be asked to: Drink more fluids. Drink an oral rehydration solution (ORS). This drink helps get the right amounts of fluids and salts and minerals in the blood (electrolytes). Very bad dehydration can be treated: With fluids through an IV tube. By getting normal levels of salts and minerals in your blood. This is often done by giving salts and minerals through a tube. The tube is passed through your nose and into your stomach. By treating the root cause. Follow these instructions at home: Oral rehydration solution If told by your doctor, drink an ORS: Make an ORS. Use instructions on the package. Start by drinking small amounts, about  cup (120 mL) every 5-10 minutes. Slowly drink more until you have had the amount that your doctor said to have. Eating and drinking     Drink enough clear fluid to keep your pee pale yellow. If you were told to drink an ORS, finish the ORS first. Then, start slowly drinking other clear fluids. Drink fluids such as: Water. Do not drink only water. Doing that can make the salt (sodium) level in your body get  too low. Water from ice chips you suck on. Fruit juice that you have added water to (diluted). Low-calorie sports drinks. Eat foods that have the right amounts of salts and minerals, such as: Bananas. Oranges. Potatoes. Tomatoes. Spinach. Do not drink alcohol. Avoid: Drinks that have a lot of sugar. These include: High-calorie sports drinks. Fruit juice that you did not add water to. Soda. Caffeine. Foods that are greasy or have a lot of fat or sugar. General instructions Take over-the-counter and prescription medicines only as told by your doctor. Do not take salt tablets. Doing that can make the salt level in your body get too high. Return to your normal activities as told by your doctor. Ask your doctor what  activities are safe for you. Keep all follow-up visits as told by your doctor. This is important. Contact a doctor if: You have pain in your belly (abdomen) and the pain: Gets worse. Stays in one place. You have a rash. You have a stiff neck. You get angry or annoyed (irritable) more easily than normal. You are more tired or have a harder time waking than normal. You feel: Weak or dizzy. Very thirsty. Get help right away if you have: Any symptoms of very bad dehydration. Symptoms of vomiting, such as: You cannot eat or drink without vomiting. Your vomiting gets worse or does not go away. Your vomit has blood or green stuff in it. Symptoms that get worse with treatment. A fever. A very bad headache. Problems with peeing or pooping (having a bowel movement), such as: Watery poop that gets worse or does not go away. Blood in your poop (stool). This may cause poop to look black and tarry. Not peeing in 6-8 hours. Peeing only a small amount of very dark pee in 6-8 hours. Trouble breathing. These symptoms may be an emergency. Do not wait to see if the symptoms will go away. Get medical help right away. Call your local emergency services (911 in the [place] Do not drive yourself to the hospital. Summary Dehydration is a condition in which there is not enough water or other fluids in the body. This happens when a person loses more fluids than he or she takes in. Treatment for this condition depends on how bad it is. Treatment should be started right away. Do not wait until your condition gets very bad. Drink enough clear fluid to keep your pee pale yellow. If you were told to drink an oral rehydration solution (ORS), finish the ORS first. Then, start slowly drinking other clear fluids. Take over-the-counter and prescription medicines only as told by your doctor. Get help right away if you have any symptoms of very bad dehydration. This information is not intended to replace advice given to  you by your health care provider. Make sure you discuss any questions you have with your health care provider. Document Revised: 09/12/2018 Document Reviewed: 09/12/2018 Elsevier Patient Education  [place].  Ondansetron Injection What is this medication? ONDANSETRON (on DAN se tron) prevents nausea and vomiting from chemotherapy, radiation, or surgery. It works by blocking substances in the body that may cause nausea or vomiting. It belongs to a groupd of medications called antiemetics. This medicine may be used for other purposes; ask your health care provider or pharmacist if you have questions. COMMON BRAND NAME(S): Zofran, Zofran in Dextrose, Zofran Solution What should I tell my care team before I take this medication? They need to know if you have any of these conditions: Heart disease  History of irregular heartbeat Liver disease Low levels of magnesium or potassium in the blood An unusual or allergic reaction to ondansetron, granisetron, other medications, foods, dyes, or preservatives Pregnant or trying to get pregnant Breast-feeding How should I use this medication? This medication is for infusion into a vein. It is given in a hospital or clinic. Talk to your care team regarding the use of this medication in children. Special care may be needed. Overdosage: If you think you have taken too much of this medicine contact a poison control center or emergency room at once. NOTE: This medicine is only for you. Do not share this medicine with others. What if I miss a dose? This does not apply. What may interact with this medication? Do not take this medication with any of the following: Apomorphine Certain medications for fungal infections like fluconazole, itraconazole, ketoconazole, posaconazole, voriconazole Cisapride Dronedarone Pimozide Thioridazine This medication may also interact with the following: Carbamazepine Certain medications for depression, anxiety, or  psychotic disturbances Fentanyl Linezolid MAOIs like Carbex, Eldepryl, Marplan, Nardil, and Parnate Methylene blue (injected into a vein) Other medications that prolong the QT interval (cause an abnormal heart rhythm) like dofetilide, ziprasidone Phenytoin Rifampicin Tramadol This list may not describe all possible interactions. Give your health care provider a list of all the medicines, herbs, non-prescription drugs, or dietary supplements you use. Also tell them if you smoke, drink alcohol, or use illegal drugs. Some items may interact with your medicine. What should I watch for while using this medication? Your condition will be monitored carefully while you are receiving this medication. What side effects may I notice from receiving this medication? Side effects that you should report to your care team as soon as possible: Allergic reactions--skin rash, itching, hives, swelling of the face, lips, tongue, or throat Bowel blockage--stomach cramping, unable to have a bowel movement or pass gas, loss of appetite, vomiting Chest pain (angina)--pain, pressure, or tightness in the chest, neck, back, or arms Heart rhythm changes--fast or irregular heartbeat, dizziness, feeling faint or lightheaded, chest pain, trouble breathing Irritability, confusion, fast or irregular heartbeat, muscle stiffness, twitching muscles, sweating, high fever, seizure, chills, vomiting, diarrhea, which may be signs of serotonin syndrome Side effects that usually do not require medical attention (report to your care team if they continue or are bothersome): Constipation Diarrhea General discomfort and fatigue Headache This list may not describe all possible side effects. Call your doctor for medical advice about side effects. You may report side effects to FDA at 1-800-FDA-1088. Where should I keep my medication? This medication is given in a hospital or clinic and will not be stored at home. NOTE: This sheet is a  summary. It may not cover all possible information. If you have questions about this medicine, talk to your doctor, pharmacist, or health care provider.  2022 Elsevier/Gold Standard (2020-03-05 00:00:00)

## 2021-03-11 ENCOUNTER — Inpatient Hospital Stay: Payer: Medicare Other

## 2021-03-11 VITALS — BP 107/53 | HR 72 | Temp 97.6°F | Resp 17

## 2021-03-11 DIAGNOSIS — Z5112 Encounter for antineoplastic immunotherapy: Secondary | ICD-10-CM | POA: Diagnosis not present

## 2021-03-11 DIAGNOSIS — R110 Nausea: Secondary | ICD-10-CM

## 2021-03-11 DIAGNOSIS — E860 Dehydration: Secondary | ICD-10-CM

## 2021-03-11 MED ORDER — ONDANSETRON HCL 4 MG/2ML IJ SOLN
8.0000 mg | Freq: Once | INTRAMUSCULAR | Status: AC
  Administered 2021-03-11: 8 mg via INTRAVENOUS
  Filled 2021-03-11: qty 4

## 2021-03-11 MED ORDER — HEPARIN SOD (PORK) LOCK FLUSH 100 UNIT/ML IV SOLN
500.0000 [IU] | Freq: Once | INTRAVENOUS | Status: AC
  Administered 2021-03-11: 500 [IU] via INTRAVENOUS
  Filled 2021-03-11: qty 5

## 2021-03-11 MED ORDER — SODIUM CHLORIDE 0.9 % IV SOLN
INTRAVENOUS | Status: DC
  Filled 2021-03-11 (×2): qty 250

## 2021-03-11 MED ORDER — SODIUM CHLORIDE 0.9% FLUSH
10.0000 mL | Freq: Once | INTRAVENOUS | Status: AC
  Administered 2021-03-11: 10 mL via INTRAVENOUS
  Filled 2021-03-11: qty 10

## 2021-03-11 MED ORDER — SODIUM CHLORIDE 0.9 % IV SOLN: 8.0000 mg | Freq: Once | INTRAVENOUS | Status: DC

## 2021-03-12 ENCOUNTER — Encounter: Payer: Self-pay | Admitting: Oncology

## 2021-03-14 ENCOUNTER — Telehealth: Payer: Self-pay | Admitting: *Deleted

## 2021-03-14 NOTE — Telephone Encounter (Signed)
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 She requests we call him to reschule these appts

## 2021-03-17 ENCOUNTER — Other Ambulatory Visit: Payer: Commercial Managed Care - PPO

## 2021-03-17 ENCOUNTER — Ambulatory Visit: Payer: Commercial Managed Care - PPO | Admitting: Oncology

## 2021-03-17 ENCOUNTER — Ambulatory Visit: Payer: Commercial Managed Care - PPO

## 2021-03-17 ENCOUNTER — Encounter: Payer: Commercial Managed Care - PPO | Admitting: Hospice and Palliative Medicine

## 2021-03-18 ENCOUNTER — Telehealth: Payer: Self-pay | Admitting: Oncology

## 2021-03-18 NOTE — Telephone Encounter (Signed)
Pt is in [place] to reschedule his appt for 2-9.  Needs appt after 2-24.

## 2021-03-24 ENCOUNTER — Ambulatory Visit: Payer: Commercial Managed Care - PPO | Admitting: Oncology

## 2021-03-24 ENCOUNTER — Other Ambulatory Visit: Payer: Commercial Managed Care - PPO

## 2021-03-24 ENCOUNTER — Ambulatory Visit: Payer: Commercial Managed Care - PPO

## 2021-03-25 ENCOUNTER — Ambulatory Visit: Payer: Commercial Managed Care - PPO

## 2021-04-08 NOTE — Progress Notes (Unsigned)
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  INTERVAL HISTORY: Patient returns to clinic today for further evaluation and to assess his toleration of cycle 1 of R-CHOP.  He reports increased nausea and poor appetite as well as increased weakness and fatigue.  He states his pain is improved, but his wife reports increasing narcotic use.  He denies any night sweats, fevers, or weight loss.  He has no neurologic complaints.  He has no chest pain, shortness of breath, cough, or hemoptysis.  He denies any constipation or diarrhea.  He has no urinary complaints.  Patient feels generally terrible, but offers no further specific complaints today.  REVIEW OF SYSTEMS:   Review of Systems  Constitutional:  Positive for malaise/fatigue. Negative for fever and weight loss.  Respiratory: Negative.  Negative for cough, hemoptysis and shortness of breath.   Cardiovascular: Negative.  Negative for chest pain and leg swelling.  Gastrointestinal:  Positive for nausea. Negative for abdominal pain.  Genitourinary: Negative.  Negative for dysuria.  Musculoskeletal:  Positive for back pain.  Skin: Negative.  Negative for rash.  Neurological:  Positive for weakness. Negative for dizziness, focal weakness and headaches.  Psychiatric/Behavioral: Negative.  The patient is not nervous/anxious.    As per HPI. Otherwise, a complete review of systems is negative.  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   a.)  s/p RIGHT sided PTA and stenting   MDD (major depressive disorder)    Myocardial infarction ([place]) 02/21/2006   Neuropathy    OSA (obstructive sleep apnea)    Peripheral vascular disease (HCC)    Postlaminectomy syndrome of lumbar region 09/21/2011   Postoperative atrial fibrillation (HCC)    Statin intolerance    Thoracic aortic aneurysm    s/p dissection and repair in 08/2012    PAST SURGICAL HISTORY: Past Surgical History:  Procedure Laterality Date   ANGIOPLASTY / STENTING ILIAC Right 04/03/2006   Procedure(s): abdominal aortography, bilateral lower extremity runoff, iliac PTA and stenting; Location: [facility]; Surgeon: Dionne Ano, MD   BLEPHAROPLASTY Bilateral 05/06/2019   Procedure: BLEPHAROPLASTY, UPPER EYELID; WITH EXCESSIVE SKIN WEIGHTING DOWN LID; Surgeon: Lucilla Lame, MD; Location: [place]; Service: Ophthalmology; Laterality: Bilateral   CARDIAC CATHETERIZATION N/A 03/15/2006   Location: [facility]; Surgeon: Samella Parr, MD   CARDIAC CATHETERIZATION N/A 03/23/2011   Location: [facility]; Surgeon: Kathe Mariner, MD   CATARACT EXTRACTION W/ INTRAOCULAR LENS IMPLANT Right 03/04/2019   Procedure: EXTRACAPSULAR CATARACT PHACO REMOVAL WITH INSERTION OF INTRAOCULAR LENS PROSTHESIS (1 STAGE PROCEDURE), MANUAL OR MECHANICAL TECHNIQUE; Surgeon: Christean Grief, MD; Location: [facility]; Service: Ophthalmology; Laterality: Right; incision approximated   CATARACT EXTRACTION W/ INTRAOCULAR LENS IMPLANT Left 02/18/2019   Procedure: DEXTENZA- EXTRACAPSULAR CATARACT ECCE REMOVAL WITH INSERTION OF INTRAOCULAR LENS PROSTHESIS (1 STAGE PROCEDURE), MANUAL OR MECHANICAL TECHNIQUE; Surgeon: Christean Grief, MD; Location:  [facility]; Service: Ophthalmology; Laterality: Left   COLONOSCOPY     CORONARY ARTERY BYPASS GRAFT N/A 03/29/2011   4v; LIMA-LAD, SVG-RI, SVG-D1, SVG-RCA   ELECTROCONVULSIVE THERAPY N/A    Multiple  treatments   FRACTURE SURGERY     IR IMAGING GUIDED PORT INSERTION  02/11/2021   LUMBAR LAMINECTOMY N/A 02/2004   L4-L5 and L5-S1   PERCUTANEOUS CORONARY STENT INTERVENTION (PCI-S) N/A 02/21/2006   Procedure: PCI with 3.5 x 16 mm Liberte BMS x 1 to RCA; Location: [facility]; Surgeon: Dionne Ano, MD   PERCUTANEOUS CORONARY STENT INTERVENTION (PCI-S) N/A 09/18/2006   Procedure: overlapping 3.5 x 18 mm Xience DES to in stent of BMS in RCA; Location: [facility]; Surgeon: Lovena Neighbours, MD   REPAIR OF ACUTE ASCENDING THORACIC AORTIC DISSECTION N/A 08/14/2012   Redo sternotomy for repair Type A dissection with ascending aortic graft, aortic valve resuspension, aortic valve central plication of non-coronary cusp, STJ remodeling; Location: [facility]; Surgeon: Dr. Cheree Ditto   REVISION TOTAL KNEE ARTHROPLASTY Right 11/2009   TONSILLECTOMY     TOTAL HIP ARTHROPLASTY Right 08/24/2020   Procedure: TOTAL HIP ARTHROPLASTY ANTERIOR APPROACH;  Surgeon: Hessie Knows, MD;  Location: [facility];  Service: Orthopedics;  Laterality: Right;   TOTAL KNEE ARTHROPLASTY Right 02/2008    FAMILY HISTORY: No family history on file.  ADVANCED DIRECTIVES (Y/N):  N  HEALTH MAINTENANCE: Social History   Tobacco Use   Smoking status: Former    Types: Cigars    Quit date: 03/19/2011    Years since quitting: 10.0   Smokeless tobacco: Never  Vaping Use   Vaping Use: Never used  Substance Use Topics   Alcohol use: Not Currently   Drug use: Never     Colonoscopy:  PAP:  Bone density:  Lipid panel:  Allergies  Allergen Reactions   Vortioxetine Other (See Comments)    Serotonin syndrome     Atorvastatin Other (See Comments)     Muscle Pain   Bupropion Other (See Comments)    vision problem     Morphine Nausea Only and Other (See Comments)    "dreams"    Pravastatin Other (See Comments)     Muscle Pain    Rosuvastatin Other (See Comments)    Muscle Pain   Felodipine Other (See Comments)     sexual dysfunction      Hydrochlorothiazide W-Triamterene Other (See Comments)    leg cramps     Lisinopril Other (See Comments)     jitteriness     Metoprolol Other (See Comments)    sexual dys, abn tongue sens.      Current Outpatient Medications  Medication Sig Dispense Refill   allopurinol (ZYLOPRIM) 300 MG tablet Take 1 tablet (300 mg total) by mouth daily. 30 tablet 3   amphetamine-dextroamphetamine (ADDERALL) 20 MG tablet Take 20 mg by mouth 2 (two) times daily.     AUSTEDO 9 MG TABS Take 9 mg by mouth 2 (two) times daily.     benazepril (LOTENSIN) 20 MG tablet Take 20 mg by mouth at bedtime.     celecoxib (CELEBREX) 200 MG capsule Take 1 capsule by mouth 2 (two) times daily.     cetirizine (ZYRTEC) 10 MG tablet Take 10 mg by mouth daily as needed for allergies.     Cholecalciferol (VITAMIN D3 SUPER STRENGTH) 50 MCG (2000 UT) TABS Take 2,000 Units by mouth daily.     dexamethasone (DECADRON) 4 MG tablet Take 1 tablet (4 mg total) by mouth daily. [place]  tablet 3   escitalopram (LEXAPRO) 20 MG tablet Take 20 mg by mouth at bedtime.     fluticasone (FLONASE) 50 MCG/ACT nasal spray Place 2 sprays into the nose daily.     ibuprofen (ADVIL) 200 MG tablet Take 800 mg by mouth daily as needed.     LATUDA 40 MG TABS tablet Take 40 mg by mouth at bedtime.     lidocaine-prilocaine (EMLA) cream Apply to affected area once 30 g 3   Multiple Vitamins-Minerals (MULTIVITAMIN WITH MINERALS) tablet Take 1 tablet by mouth daily.     ondansetron (ZOFRAN) 8 MG tablet Take 1 tablet (8 mg total) by mouth 2 (two) times daily as needed for refractory nausea / vomiting. 60 tablet 2   oxyCODONE (OXY IR/ROXICODONE) 5 MG immediate release tablet Take 1-2 tablets (5-10 mg total) by mouth every 4 (four) hours as needed for severe pain. 60 tablet 0   pantoprazole (PROTONIX) 40 MG tablet Take 40 mg by mouth daily.     polyethylene glycol (MIRALAX / GLYCOLAX) 17 g packet Take 17 g by mouth daily. 14 each 1   predniSONE (DELTASONE) 20 MG  tablet Take 5 tablets (100 mg total) by mouth daily. Take with food on days 1-5 of chemotherapy. 25 tablet 5   prochlorperazine (COMPAZINE) 10 MG tablet Take 1 tablet (10 mg total) by mouth every 6 (six) hours as needed (Nausea or vomiting). 60 tablet 2   SYMBICORT 80-4.5 MCG/ACT inhaler Inhale 2 puffs into the lungs 2 (two) times daily.     testosterone cypionate (DEPOTESTOSTERONE CYPIONATE) 200 MG/ML injection Inject 200 mg into the muscle every 14 (fourteen) days.     TIADYLT ER 360 MG 24 hr capsule Take 360 mg by mouth at bedtime.     traMADol (ULTRAM) 50 MG tablet Take 1 tablet (50 mg total) by mouth every 6 (six) hours. (Patient not taking: Reported on 02/22/2021) 30 tablet 0   VASCEPA 1 g capsule Take 2 g by mouth 2 (two) times daily.     No current facility-administered medications for this visit.    OBJECTIVE: There were no vitals filed for this visit.    There is no height or weight on file to calculate BMI.    ECOG FS:1 - Symptomatic but completely ambulatory  General: Ill-appearing, no acute distress. Eyes: Pink conjunctiva, anicteric sclera. HEENT: Normocephalic, moist mucous membranes. Lungs: No audible wheezing or coughing. Heart: Regular rate and rhythm. Abdomen: Soft, nontender, no obvious distention. Musculoskeletal: No edema, cyanosis, or clubbing. Neuro: Alert, answering all questions appropriately. Cranial nerves grossly intact. Skin: No rashes or petechiae noted. Psych: Normal affect.   LAB RESULTS:  Lab Results  Component Value Date   NA 137 03/10/2021   K 4.0 03/10/2021   CL 102 03/10/2021   CO2 21 (L) 03/10/2021   GLUCOSE 146 (H) 03/10/2021   BUN 20 03/10/2021   CREATININE 0.79 03/10/2021   CALCIUM 8.6 (L) 03/10/2021   PROT 6.1 (L) 03/10/2021   ALBUMIN 3.2 (L) 03/10/2021   AST 33 03/10/2021   ALT 46 (H) 03/10/2021   ALKPHOS 62 03/10/2021   BILITOT 1.5 (H) 03/10/2021   GFRNONAA >60 03/10/2021    Lab Results  Component Value Date   WBC 1.2 (LL)  03/10/2021   NEUTROABS 0.1 (LL) 03/10/2021   HGB 13.9 03/10/2021   HCT 41.9 03/10/2021   MCV 92.9 03/10/2021   PLT 144 (L) 03/10/2021     STUDIES: No results found.  ASSESSMENT: Stage I E bulky  large B-cell lymphoma  PLAN:    1.  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  There is also some enhancing tumor in the right S1 neuroforamen displacing the nerve root anteriorly.  Biopsy confirmed diagnosis.  PET scan and bone marrow biopsy did not reveal any distant disease.  Spinal fluid was negative for malignancy.  Patient has had port placement.  Echo revealed EF of 65% and adequate to proceed with treatment.  Patient will likely receive 4 cycles of R-CHOP chemotherapy with Udenyca support every 3 weeks and then referral to radiation oncology for adjuvant XRT given the bulky nature of patient's disease.  Patient received cycle 1 of R-CHOP last week.  He now has decreased performance status, appetite, and increased nausea.  He received 1 L of IV fluids today and tomorrow in clinic.  Return to clinic in 1 week for laboratory work, further evaluation, and consideration of IV fluids, and then in 2 weeks as previously scheduled for consideration of cycle 2.   2.  Back pain: Difficult to assess.  Patient reports improvement, but wife states he is using increased narcotics.  Will schedule appointment with palliative care in the near future. 3.  Neutropenia: Secondary to treatment.  Patient received Udenyca.   4.  Thrombocytosis: Resolved. 5.  Elevated bilirubin and ALT: Mild, possibly secondary to treatment.  Monitor.    Patient expressed understanding and was in agreement with this plan. He also understands that He can call clinic at any time with any questions, concerns, or complaints.    Cancer Staging  Large B-cell lymphoma ([place]) Staging form: Hodgkin  and Non-Hodgkin Lymphoma, AJCC 8th Edition - Clinical stage from 02/23/2021: Stage II bulky (Diffuse large B-cell lymphoma) - Signed by Lloyd Huger, MD on 02/23/2021 Stage prefix: Initial diagnosis   Lloyd Huger, MD   04/08/2021 3:52 PM

## 2021-04-12 ENCOUNTER — Inpatient Hospital Stay: Payer: Commercial Managed Care - PPO | Attending: Oncology

## 2021-04-12 ENCOUNTER — Inpatient Hospital Stay: Payer: Commercial Managed Care - PPO | Admitting: Oncology

## 2021-04-12 DIAGNOSIS — C8510 Unspecified B-cell lymphoma, unspecified site: Secondary | ICD-10-CM

## 2021-04-21 LAB — SURGICAL PATHOLOGY

## 2021-05-09 ENCOUNTER — Telehealth: Payer: Self-pay | Admitting: Emergency Medicine

## 2021-05-09 NOTE — Telephone Encounter (Signed)
Wife called and left voicemail to make [place] aware that pt is being discharged from the hospital and she would like a phone call back. Returned phone call to wife after making Dr. Grayland Ormond aware of current discharge disposition. Left voicemail thanking wife for letting us know of the upcoming discharge and that we had the notes/test results from his hospital stay per her concern and to call us back if she has any further questions.  ?

## 2021-05-15 NOTE — Progress Notes (Deleted)
?[place]  ?Telephone:(336) [phone] [phone] ? ?ID: Ilda Mori OB: 05-23-1945  MR#: [id number]  XTG#:[id number] ? ?Patient Care Team: ?Donnamarie Rossetti, PA-C as PCP - General (Family Medicine) ? 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 ? ?INTERVAL HISTORY: Patient returns to clinic today for further evaluation and to assess his toleration of cycle 1 of R-CHOP.  He reports increased nausea and poor appetite as well as increased weakness and fatigue.  He states his pain is improved, but his wife reports increasing narcotic use.  He denies any night sweats, fevers, or weight loss.  He has no neurologic complaints.  He has no chest pain, shortness of breath, cough, or hemoptysis.  He denies any constipation or diarrhea.  He has no urinary complaints.  Patient feels generally terrible, but offers no further specific complaints today. ? ?REVIEW OF SYSTEMS:   ?Review of Systems  ?Constitutional:  Positive for malaise/fatigue. Negative for fever and weight loss.  ?Respiratory: Negative.  Negative for cough, hemoptysis and shortness of breath.   ?Cardiovascular: Negative.  Negative for chest pain and leg swelling.  ?Gastrointestinal:  Positive for nausea. Negative for abdominal pain.  ?Genitourinary: Negative.  Negative for dysuria.  ?Musculoskeletal:  Positive for back pain.  ?Skin: Negative.  Negative for rash.  ?Neurological:  Positive for weakness. Negative for dizziness, focal weakness and headaches.  ?Psychiatric/Behavioral: Negative.  The patient is not nervous/anxious.   ? ?As per HPI. Otherwise, a complete review of systems is negative. ? ?PAST MEDICAL HISTORY: ?Past Medical History:  ?Diagnosis Date  ? Anemia   ? Anxiety   ? Aortic valve disease   ? Arthritis   ? Asthma   ? Candidal esophagitis ([place])   ? Chronic back pain   ? Coronary artery disease   ? GERD (gastroesophageal reflux disease)   ? Huntington disease ([place])   ? Hx of CABG   ? Hyperlipidemia   ?  Hypertension   ? 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 a.)  s/p RIGHT sided PTA and stenting  ? MDD (major depressive disorder)   ? Myocardial infarction ([place]) 02/21/2006  ? Neuropathy   ? OSA (obstructive sleep apnea)   ? Peripheral vascular disease ([place])   ? Postlaminectomy syndrome of lumbar region 09/21/2011  ? Postoperative atrial fibrillation (HCC)   ? Statin intolerance   ? Thoracic aortic aneurysm   ? s/p dissection and repair in 08/2012  ? ? ?PAST SURGICAL HISTORY: ?Past Surgical History:  ?Procedure Laterality Date  ? ANGIOPLASTY / STENTING ILIAC Right 04/03/2006  ? Procedure(s): abdominal aortography, bilateral lower extremity runoff, iliac PTA and stenting; Location: [facility]; Surgeon: Dionne Ano, MD  ? BLEPHAROPLASTY Bilateral 05/06/2019  ? Procedure: BLEPHAROPLASTY, UPPER EYELID; WITH EXCESSIVE SKIN WEIGHTING DOWN LID; Surgeon: Lucilla Lame, MD; Location: [facility]; Service: Ophthalmology; Laterality: Bilateral  ? CARDIAC CATHETERIZATION N/A 03/15/2006  ? Location: [facility]; Surgeon: Samella Parr, MD  ? CARDIAC CATHETERIZATION N/A 03/23/2011  ? Location: [facility]; Surgeon: Kathe Mariner, MD  ? CATARACT EXTRACTION W/ INTRAOCULAR LENS IMPLANT Right 03/04/2019  ? Procedure: EXTRACAPSULAR CATARACT PHACO REMOVAL WITH INSERTION OF INTRAOCULAR LENS PROSTHESIS (1 STAGE PROCEDURE), MANUAL OR MECHANICAL TECHNIQUE; Surgeon: Christean Grief, MD; Location: [facility]; Service: Ophthalmology; Laterality: Right; incision approximated  ? CATARACT EXTRACTION W/ INTRAOCULAR LENS IMPLANT Left 02/18/2019  ? Procedure: DEXTENZA- EXTRACAPSULAR CATARACT ECCE REMOVAL WITH INSERTION OF INTRAOCULAR LENS PROSTHESIS (1 STAGE PROCEDURE), MANUAL OR MECHANICAL TECHNIQUE; Surgeon: Christean Grief, MD; Location:  [facility]; Service: Ophthalmology; Laterality: Left  ? COLONOSCOPY    ? CORONARY ARTERY BYPASS GRAFT N/A 03/29/2011  ? 4v; LIMA-LAD, SVG-RI, SVG-D1, SVG-RCA  ? ELECTROCONVULSIVE THERAPY N/A   ? Multiple  treatments  ? FRACTURE SURGERY    ? IR IMAGING GUIDED PORT INSERTION  02/11/2021  ? LUMBAR LAMINECTOMY N/A 02/2004  ? L4-L5 and L5-S1  ? PERCUTANEOUS CORONARY STENT INTERVENTION (PCI-S) N/A 02/21/2006  ? Procedure: PCI with 3.5 x 16 mm Liberte BMS x 1 to RCA; Location: [facility]; Surgeon: Dionne Ano, MD  ? PERCUTANEOUS CORONARY STENT INTERVENTION (PCI-S) N/A 09/18/2006  ? Procedure: overlapping 3.5 x 18 mm Xience DES to in stent of BMS in RCA; Location: [facility]; Surgeon: Lovena Neighbours, MD  ? REPAIR OF ACUTE ASCENDING THORACIC AORTIC DISSECTION N/A 08/14/2012  ? Redo sternotomy for repair Type A dissection with ascending aortic graft, aortic valve resuspension, aortic valve central plication of non-coronary cusp, STJ remodeling; Location: [facility]; Surgeon: Dr. Cheree Ditto  ? REVISION TOTAL KNEE ARTHROPLASTY Right 11/2009  ? TONSILLECTOMY    ? TOTAL HIP ARTHROPLASTY Right 08/24/2020  ? Procedure: TOTAL HIP ARTHROPLASTY ANTERIOR APPROACH;  Surgeon: Hessie Knows, MD;  Location: [facility];  Service: Orthopedics;  Laterality: Right;  ? TOTAL KNEE ARTHROPLASTY Right 02/2008  ? ? ?FAMILY HISTORY: ?No family history on file. ? ?ADVANCED DIRECTIVES (Y/N):  N ? ?HEALTH MAINTENANCE: ?Social History  ? ?Tobacco Use  ? Smoking status: Former  ?  Types: Cigars  ?  Quit date: 03/19/2011  ?  Years since quitting: 10.1  ? Smokeless tobacco: Never  ?Vaping Use  ? Vaping Use: Never used  ?Substance Use Topics  ? Alcohol use: Not Currently  ? Drug use: Never  ? ? ? Colonoscopy: ? PAP: ? Bone density: ? Lipid panel: ? ?Allergies  ?Allergen Reactions  ? Vortioxetine Other (See Comments)  ?  Serotonin syndrome ? ?  ? Atorvastatin Other (See Comments)  ?   Muscle Pain  ? Bupropion Other (See Comments)  ?  vision problem ? ?  ? Morphine Nausea Only and Other (See Comments)  ?  "dreams" ?  ? Pravastatin Other (See Comments)  ?   Muscle Pain ?  ? Rosuvastatin Other (See Comments)  ?  Muscle Pain  ? Felodipine Other (See Comments)  ?   sexual dysfunction ? ?  ?  Hydrochlorothiazide W-Triamterene Other (See Comments)  ?  leg cramps ? ?  ? Lisinopril Other (See Comments)  ?   jitteriness ? ?  ? Metoprolol Other (See Comments)  ?  sexual dys, abn tongue sens. ? ?  ? ? ?Current Outpatient Medications  ?Medication Sig Dispense Refill  ? allopurinol (ZYLOPRIM) 300 MG tablet Take 1 tablet (300 mg total) by mouth daily. 30 tablet 3  ? amphetamine-dextroamphetamine (ADDERALL) 20 MG tablet Take 20 mg by mouth 2 (two) times daily.    ? AUSTEDO 9 MG TABS Take 9 mg by mouth 2 (two) times daily.    ? benazepril (LOTENSIN) 20 MG tablet Take 20 mg by mouth at bedtime.    ? celecoxib (CELEBREX) 200 MG capsule Take 1 capsule by mouth 2 (two) times daily.    ? cetirizine (ZYRTEC) 10 MG tablet Take 10 mg by mouth daily as needed for allergies.    ? Cholecalciferol (VITAMIN D3 SUPER STRENGTH) 50 MCG (2000 UT) TABS Take 2,000 Units by mouth daily.    ? dexamethasone (DECADRON) 4 MG tablet Take 1 tablet (4 mg total) by mouth daily. [place]  tablet 3  ? escitalopram (LEXAPRO) 20 MG tablet Take 20 mg by mouth at bedtime.    ? fluticasone (FLONASE) 50 MCG/ACT nasal spray Place 2 sprays into the nose daily.    ? ibuprofen (ADVIL) 200 MG tablet Take 800 mg by mouth daily as needed.    ? LATUDA 40 MG TABS tablet Take 40 mg by mouth at bedtime.    ? lidocaine-prilocaine (EMLA) cream Apply to affected area once 30 g 3  ? Multiple Vitamins-Minerals (MULTIVITAMIN WITH MINERALS) tablet Take 1 tablet by mouth daily.    ? ondansetron (ZOFRAN) 8 MG tablet Take 1 tablet (8 mg total) by mouth 2 (two) times daily as needed for refractory nausea / vomiting. 60 tablet 2  ? oxyCODONE (OXY IR/ROXICODONE) 5 MG immediate release tablet Take 1-2 tablets (5-10 mg total) by mouth every 4 (four) hours as needed for severe pain. 60 tablet 0  ? pantoprazole (PROTONIX) 40 MG tablet Take 40 mg by mouth daily.    ? polyethylene glycol (MIRALAX / GLYCOLAX) 17 g packet Take 17 g by mouth daily. 14 each 1  ? predniSONE (DELTASONE) 20 MG  tablet Take 5 tablets (100 mg total) by mouth daily. Take with food on days 1-5 of chemotherapy. 25 tablet 5  ? prochlorperazine (COMPAZINE) 10 MG tablet Take 1 tablet (10 mg total) by mouth every 6 (six) hour

## 2021-05-19 ENCOUNTER — Inpatient Hospital Stay: Payer: Commercial Managed Care - PPO

## 2021-05-19 ENCOUNTER — Inpatient Hospital Stay: Payer: Commercial Managed Care - PPO | Admitting: Oncology

## 2021-05-19 DIAGNOSIS — C8510 Unspecified B-cell lymphoma, unspecified site: Secondary | ICD-10-CM

## 2021-05-20 ENCOUNTER — Inpatient Hospital Stay: Payer: Medicare Other | Attending: Oncology | Admitting: Hospice and Palliative Medicine

## 2021-05-20 DIAGNOSIS — Z515 Encounter for palliative care: Secondary | ICD-10-CM

## 2021-05-20 NOTE — Progress Notes (Signed)
I tried reaching patient by phone and went straight to voicemail.  Mailbox is full and unable to leave a message.  Will reschedule. ?

## 2021-05-20 NOTE — Addendum Note (Signed)
Addended by: Altha Harm R on: 05/20/2021 02:12 PM ? ? Modules accepted: Orders ? ?

## 2021-05-23 ENCOUNTER — Telehealth: Payer: Self-pay

## 2021-05-23 NOTE — Telephone Encounter (Signed)
Error

## 2021-05-23 NOTE — Telephone Encounter (Signed)
Dr. Mina Marble, lymphoma doctor from [facility], called to talk to [facility] about tx plan for chemotherapy. I called Dr. Grayland Ormond to let him know. He will reach to Dr. Mina Marble in regards.  ?

## 2021-05-23 NOTE — Telephone Encounter (Signed)
Dr. Mina Marble called back and stated there were starting rice chemotherapy on patient and for Dr. Woodfin Ganja to reach out with any questions or concerns. Texted Woodfin Ganja to make him aware.  ?

## 2021-05-25 ENCOUNTER — Telehealth: Payer: Self-pay | Admitting: Student

## 2021-05-25 NOTE — Telephone Encounter (Signed)
Attempted to contact patient's wife Mickel Baas, to offer to schedule a Palliative Consult, no answer - left message with reason for call along with my name and call back number. ?

## 2021-08-13 DEATH — deceased

## 2023-08-31 IMAGING — DX DG CHEST 1V PORT
1 series · 1 of 1 positions shown · non-contrast
Comparison: CT dated 02/02/2021.

CLINICAL DATA: Evaluate for infiltrate.

EXAM:
PORTABLE CHEST 1 VIEW

[chest ap]
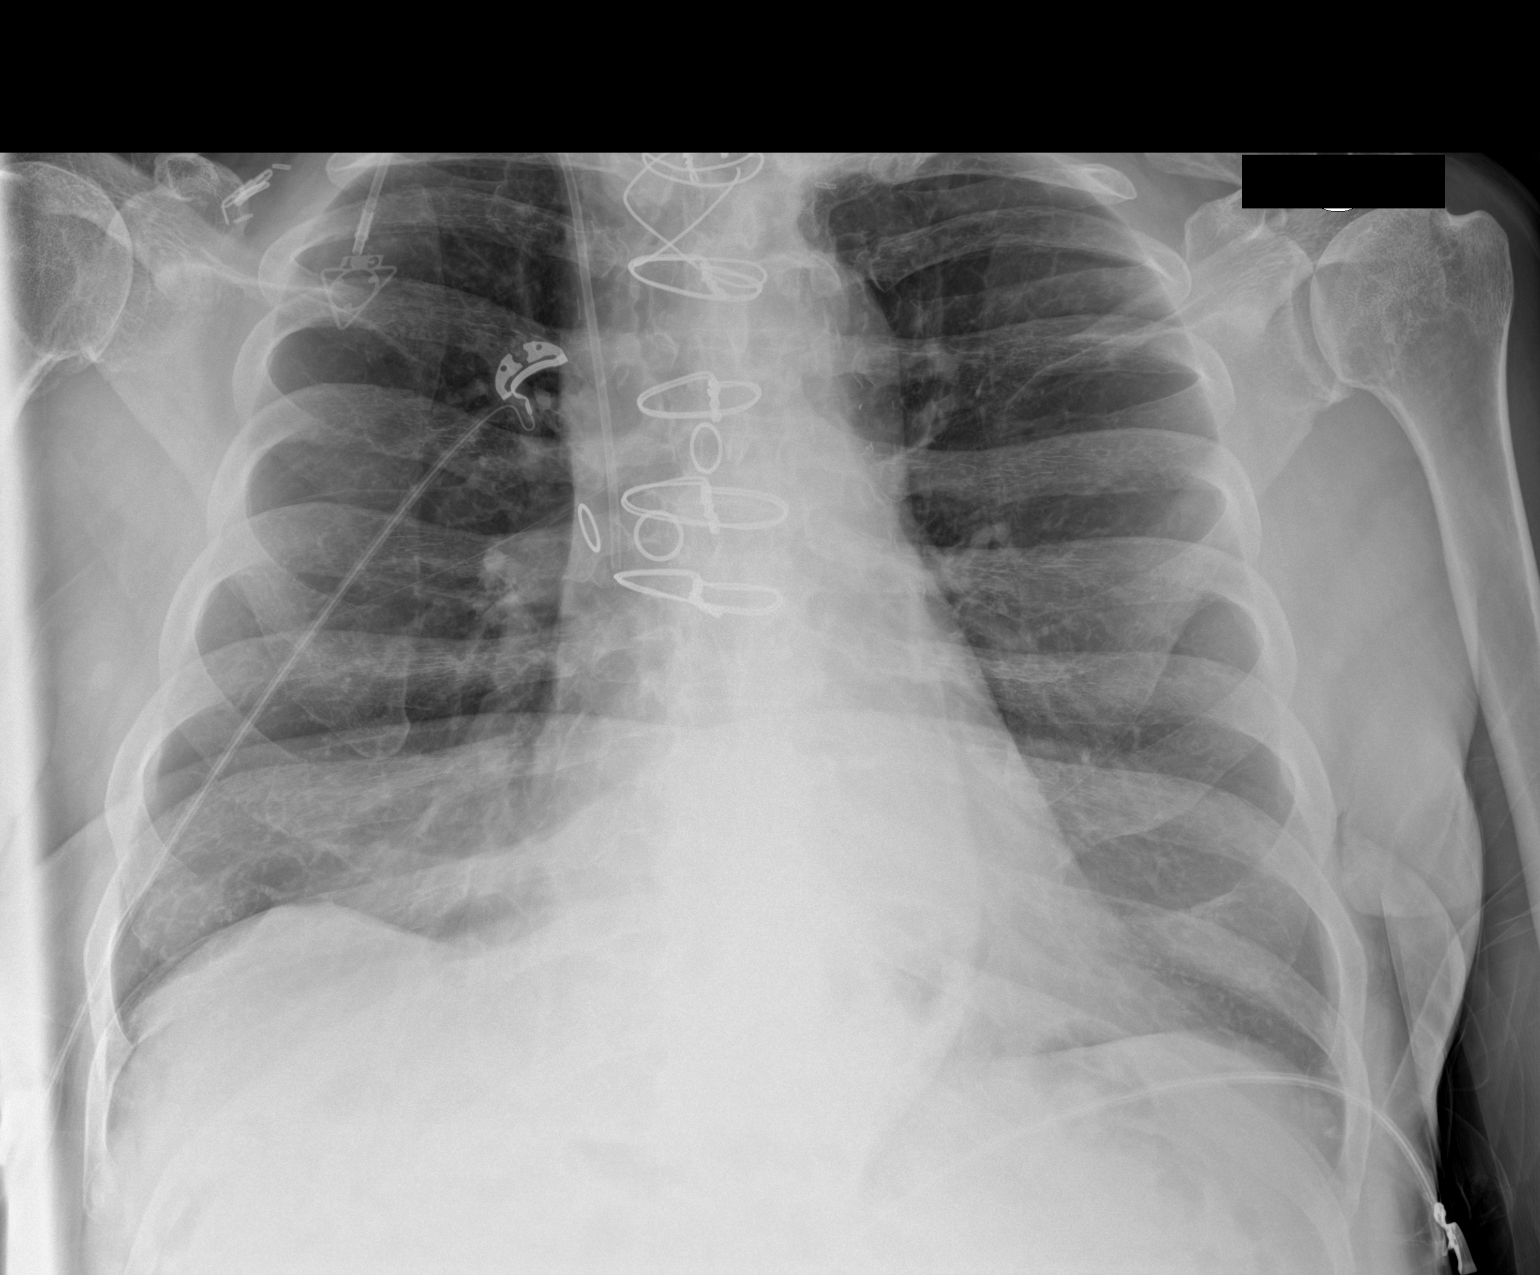

[1 of 1 positions shown; findings below may reference images not displayed]

FINDINGS: Right-sided Port-A-Cath with tip over central SVC. Minimal bibasilar
atelectasis. No focal consolidation, pleural effusion, pneumothorax.
The cardiac silhouette is within limits. Atherosclerotic
calcification of the aorta. Median sternotomy wires and CABG
vascular clips. No acute osseous pathology.
IMPRESSION: No active disease.
